# Patient Record
Sex: Female | Born: 1950 | Race: White | Hispanic: No | Marital: Married | Smoking: Former smoker
Health system: Southern US, Community
[De-identification: ages and names within clinical notes are randomized; demographics above are authoritative.]

## PROBLEM LIST (undated history)

## (undated) DIAGNOSIS — E039 Hypothyroidism, unspecified: Secondary | ICD-10-CM

## (undated) DIAGNOSIS — M1990 Unspecified osteoarthritis, unspecified site: Secondary | ICD-10-CM

## (undated) DIAGNOSIS — Z8619 Personal history of other infectious and parasitic diseases: Secondary | ICD-10-CM

## (undated) DIAGNOSIS — E785 Hyperlipidemia, unspecified: Secondary | ICD-10-CM

## (undated) DIAGNOSIS — R202 Paresthesia of skin: Secondary | ICD-10-CM

## (undated) DIAGNOSIS — S62102A Fracture of unspecified carpal bone, left wrist, initial encounter for closed fracture: Secondary | ICD-10-CM

## (undated) DIAGNOSIS — M810 Age-related osteoporosis without current pathological fracture: Secondary | ICD-10-CM

## (undated) DIAGNOSIS — R238 Other skin changes: Secondary | ICD-10-CM

## (undated) DIAGNOSIS — K219 Gastro-esophageal reflux disease without esophagitis: Secondary | ICD-10-CM

## (undated) DIAGNOSIS — T7840XA Allergy, unspecified, initial encounter: Secondary | ICD-10-CM

## (undated) DIAGNOSIS — R233 Spontaneous ecchymoses: Secondary | ICD-10-CM

## (undated) HISTORY — DX: Personal history of other infectious and parasitic diseases: Z86.19

## (undated) HISTORY — DX: Hyperlipidemia, unspecified: E78.5

## (undated) HISTORY — DX: Fracture of unspecified carpal bone, left wrist, initial encounter for closed fracture: S62.102A

## (undated) HISTORY — DX: Gastro-esophageal reflux disease without esophagitis: K21.9

## (undated) HISTORY — DX: Age-related osteoporosis without current pathological fracture: M81.0

## (undated) HISTORY — DX: Allergy, unspecified, initial encounter: T78.40XA

---

## 1991-05-18 HISTORY — PX: DENTAL SURGERY: SHX609

## 1998-05-17 HISTORY — PX: GANGLION CYST EXCISION: SHX1691

## 1998-05-27 ENCOUNTER — Ambulatory Visit (HOSPITAL_COMMUNITY): Admission: RE | Admit: 1998-05-27 | Discharge: 1998-05-27 | Payer: Self-pay | Admitting: Gastroenterology

## 1999-03-11 ENCOUNTER — Encounter: Payer: Self-pay | Admitting: Family Medicine

## 1999-03-11 ENCOUNTER — Encounter: Admission: RE | Admit: 1999-03-11 | Discharge: 1999-03-11 | Payer: Self-pay | Admitting: Family Medicine

## 1999-04-14 ENCOUNTER — Other Ambulatory Visit: Admission: RE | Admit: 1999-04-14 | Discharge: 1999-04-14 | Payer: Self-pay | Admitting: Family Medicine

## 2000-02-23 ENCOUNTER — Encounter: Admission: RE | Admit: 2000-02-23 | Discharge: 2000-02-23 | Payer: Self-pay | Admitting: Family Medicine

## 2000-02-23 ENCOUNTER — Encounter: Payer: Self-pay | Admitting: Family Medicine

## 2000-04-20 ENCOUNTER — Encounter: Admission: RE | Admit: 2000-04-20 | Discharge: 2000-04-20 | Payer: Self-pay | Admitting: Family Medicine

## 2000-04-20 ENCOUNTER — Encounter: Payer: Self-pay | Admitting: Family Medicine

## 2000-04-20 ENCOUNTER — Other Ambulatory Visit: Admission: RE | Admit: 2000-04-20 | Discharge: 2000-04-20 | Payer: Self-pay | Admitting: *Deleted

## 2000-07-19 ENCOUNTER — Encounter: Payer: Self-pay | Admitting: Family Medicine

## 2000-07-19 ENCOUNTER — Encounter: Admission: RE | Admit: 2000-07-19 | Discharge: 2000-07-19 | Payer: Self-pay | Admitting: Family Medicine

## 2001-05-04 ENCOUNTER — Encounter: Admission: RE | Admit: 2001-05-04 | Discharge: 2001-05-04 | Payer: Self-pay | Admitting: Family Medicine

## 2001-05-04 ENCOUNTER — Encounter: Payer: Self-pay | Admitting: Family Medicine

## 2002-04-17 ENCOUNTER — Encounter: Payer: Self-pay | Admitting: Family Medicine

## 2002-04-17 ENCOUNTER — Encounter: Admission: RE | Admit: 2002-04-17 | Discharge: 2002-04-17 | Payer: Self-pay | Admitting: Family Medicine

## 2002-05-29 ENCOUNTER — Other Ambulatory Visit: Admission: RE | Admit: 2002-05-29 | Discharge: 2002-05-29 | Payer: Self-pay | Admitting: Family Medicine

## 2002-07-24 ENCOUNTER — Encounter: Admission: RE | Admit: 2002-07-24 | Discharge: 2002-07-24 | Payer: Self-pay | Admitting: Family Medicine

## 2002-07-24 ENCOUNTER — Encounter: Payer: Self-pay | Admitting: Family Medicine

## 2003-05-24 ENCOUNTER — Encounter: Admission: RE | Admit: 2003-05-24 | Discharge: 2003-05-24 | Payer: Self-pay | Admitting: Family Medicine

## 2003-07-08 ENCOUNTER — Other Ambulatory Visit: Admission: RE | Admit: 2003-07-08 | Discharge: 2003-07-08 | Payer: Self-pay | Admitting: Family Medicine

## 2003-07-22 ENCOUNTER — Encounter: Admission: RE | Admit: 2003-07-22 | Discharge: 2003-07-22 | Payer: Self-pay | Admitting: Family Medicine

## 2004-03-25 ENCOUNTER — Encounter: Admission: RE | Admit: 2004-03-25 | Discharge: 2004-03-25 | Payer: Self-pay | Admitting: Family Medicine

## 2004-09-10 ENCOUNTER — Encounter: Admission: RE | Admit: 2004-09-10 | Discharge: 2004-09-10 | Payer: Self-pay | Admitting: Family Medicine

## 2004-09-16 ENCOUNTER — Other Ambulatory Visit: Admission: RE | Admit: 2004-09-16 | Discharge: 2004-09-16 | Payer: Self-pay | Admitting: Family Medicine

## 2004-09-16 ENCOUNTER — Encounter: Admission: RE | Admit: 2004-09-16 | Discharge: 2004-09-16 | Payer: Self-pay | Admitting: Family Medicine

## 2004-10-06 ENCOUNTER — Encounter: Admission: RE | Admit: 2004-10-06 | Discharge: 2004-10-06 | Payer: Self-pay | Admitting: Family Medicine

## 2005-09-21 ENCOUNTER — Encounter: Admission: RE | Admit: 2005-09-21 | Discharge: 2005-09-21 | Payer: Self-pay | Admitting: Family Medicine

## 2005-09-28 ENCOUNTER — Encounter: Admission: RE | Admit: 2005-09-28 | Discharge: 2005-09-28 | Payer: Self-pay | Admitting: Family Medicine

## 2005-10-14 ENCOUNTER — Encounter: Admission: RE | Admit: 2005-10-14 | Discharge: 2005-10-14 | Payer: Self-pay | Admitting: Family Medicine

## 2006-11-02 ENCOUNTER — Encounter: Admission: RE | Admit: 2006-11-02 | Discharge: 2006-11-02 | Payer: Self-pay | Admitting: Family Medicine

## 2007-10-04 ENCOUNTER — Encounter: Admission: RE | Admit: 2007-10-04 | Discharge: 2007-10-04 | Payer: Self-pay | Admitting: Family Medicine

## 2007-10-17 ENCOUNTER — Encounter: Admission: RE | Admit: 2007-10-17 | Discharge: 2007-10-17 | Payer: Self-pay | Admitting: Family Medicine

## 2007-10-19 ENCOUNTER — Ambulatory Visit (HOSPITAL_COMMUNITY): Admission: RE | Admit: 2007-10-19 | Discharge: 2007-10-19 | Payer: Self-pay | Admitting: Family Medicine

## 2007-11-06 ENCOUNTER — Encounter: Admission: RE | Admit: 2007-11-06 | Discharge: 2007-11-06 | Payer: Self-pay | Admitting: Family Medicine

## 2008-11-05 ENCOUNTER — Encounter: Admission: RE | Admit: 2008-11-05 | Discharge: 2008-11-05 | Payer: Self-pay | Admitting: Family Medicine

## 2008-11-20 ENCOUNTER — Encounter: Admission: RE | Admit: 2008-11-20 | Discharge: 2008-11-20 | Payer: Self-pay | Admitting: Family Medicine

## 2009-10-15 ENCOUNTER — Encounter: Admission: RE | Admit: 2009-10-15 | Discharge: 2009-10-15 | Payer: Self-pay | Admitting: Family Medicine

## 2010-04-22 ENCOUNTER — Encounter
Admission: RE | Admit: 2010-04-22 | Discharge: 2010-04-22 | Payer: Self-pay | Source: Home / Self Care | Admitting: Family Medicine

## 2010-06-08 ENCOUNTER — Encounter: Payer: Self-pay | Admitting: Family Medicine

## 2010-07-24 ENCOUNTER — Other Ambulatory Visit: Payer: Self-pay | Admitting: Diagnostic Neuroimaging

## 2010-07-24 DIAGNOSIS — R200 Anesthesia of skin: Secondary | ICD-10-CM

## 2010-07-24 DIAGNOSIS — R9089 Other abnormal findings on diagnostic imaging of central nervous system: Secondary | ICD-10-CM

## 2010-08-26 ENCOUNTER — Other Ambulatory Visit (HOSPITAL_COMMUNITY)
Admission: RE | Admit: 2010-08-26 | Discharge: 2010-08-26 | Disposition: A | Discharge status: Home / Self Care | Payer: BLUE CROSS/BLUE SHIELD | Source: Ambulatory Visit | Attending: Internal Medicine | Admitting: Internal Medicine

## 2010-08-26 ENCOUNTER — Other Ambulatory Visit: Payer: Self-pay | Admitting: Internal Medicine

## 2010-08-26 DIAGNOSIS — Z01419 Encounter for gynecological examination (general) (routine) without abnormal findings: Secondary | ICD-10-CM | POA: Insufficient documentation

## 2010-08-26 DIAGNOSIS — Z1159 Encounter for screening for other viral diseases: Secondary | ICD-10-CM | POA: Insufficient documentation

## 2010-08-27 ENCOUNTER — Ambulatory Visit
Admission: RE | Admit: 2010-08-27 | Discharge: 2010-08-27 | Disposition: A | Discharge status: Home / Self Care | Payer: BLUE CROSS/BLUE SHIELD | Source: Ambulatory Visit | Attending: Diagnostic Neuroimaging | Admitting: Diagnostic Neuroimaging

## 2010-08-27 DIAGNOSIS — R200 Anesthesia of skin: Secondary | ICD-10-CM

## 2010-08-27 DIAGNOSIS — R9089 Other abnormal findings on diagnostic imaging of central nervous system: Secondary | ICD-10-CM

## 2010-09-01 ENCOUNTER — Inpatient Hospital Stay
Admission: RE | Admit: 2010-09-01 | Discharge: 2010-09-01 | Payer: BLUE CROSS/BLUE SHIELD | Source: Ambulatory Visit | Attending: Neurology | Admitting: Neurology

## 2010-09-01 ENCOUNTER — Other Ambulatory Visit: Payer: Self-pay | Admitting: Neurology

## 2010-09-01 DIAGNOSIS — G971 Other reaction to spinal and lumbar puncture: Secondary | ICD-10-CM

## 2010-09-22 ENCOUNTER — Other Ambulatory Visit: Payer: Self-pay | Admitting: Internal Medicine

## 2010-09-22 DIAGNOSIS — Z1231 Encounter for screening mammogram for malignant neoplasm of breast: Secondary | ICD-10-CM

## 2010-10-06 ENCOUNTER — Ambulatory Visit: Payer: BLUE CROSS/BLUE SHIELD

## 2010-10-28 ENCOUNTER — Ambulatory Visit: Payer: BLUE CROSS/BLUE SHIELD

## 2011-04-13 ENCOUNTER — Ambulatory Visit
Admission: RE | Admit: 2011-04-13 | Discharge: 2011-04-13 | Disposition: A | Discharge status: Home / Self Care | Payer: BC Managed Care – PPO | Source: Ambulatory Visit | Attending: Internal Medicine | Admitting: Internal Medicine

## 2011-04-13 DIAGNOSIS — Z1231 Encounter for screening mammogram for malignant neoplasm of breast: Secondary | ICD-10-CM

## 2011-07-28 LAB — HM COLONOSCOPY: HM Colonoscopy: NORMAL

## 2011-08-27 ENCOUNTER — Other Ambulatory Visit: Payer: Self-pay | Admitting: Internal Medicine

## 2011-08-27 DIAGNOSIS — M8580 Other specified disorders of bone density and structure, unspecified site: Secondary | ICD-10-CM

## 2011-09-08 ENCOUNTER — Ambulatory Visit
Admission: RE | Admit: 2011-09-08 | Discharge: 2011-09-08 | Disposition: A | Discharge status: Home / Self Care | Payer: BC Managed Care – PPO | Source: Ambulatory Visit | Attending: Internal Medicine | Admitting: Internal Medicine

## 2011-09-08 DIAGNOSIS — M8580 Other specified disorders of bone density and structure, unspecified site: Secondary | ICD-10-CM

## 2012-03-31 ENCOUNTER — Other Ambulatory Visit: Payer: Self-pay | Admitting: Internal Medicine

## 2012-03-31 DIAGNOSIS — Z1231 Encounter for screening mammogram for malignant neoplasm of breast: Secondary | ICD-10-CM

## 2012-06-21 ENCOUNTER — Ambulatory Visit
Admission: RE | Admit: 2012-06-21 | Discharge: 2012-06-21 | Disposition: A | Discharge status: Home / Self Care | Payer: BC Managed Care – PPO | Source: Ambulatory Visit | Attending: Internal Medicine | Admitting: Internal Medicine

## 2012-06-21 DIAGNOSIS — Z1231 Encounter for screening mammogram for malignant neoplasm of breast: Secondary | ICD-10-CM

## 2012-06-22 ENCOUNTER — Other Ambulatory Visit: Payer: Self-pay | Admitting: Internal Medicine

## 2012-06-22 DIAGNOSIS — M25552 Pain in left hip: Secondary | ICD-10-CM

## 2012-06-27 ENCOUNTER — Ambulatory Visit
Admission: RE | Admit: 2012-06-27 | Discharge: 2012-06-27 | Disposition: A | Discharge status: Home / Self Care | Payer: BC Managed Care – PPO | Source: Ambulatory Visit | Attending: Internal Medicine | Admitting: Internal Medicine

## 2012-06-27 DIAGNOSIS — M25552 Pain in left hip: Secondary | ICD-10-CM

## 2012-07-10 ENCOUNTER — Encounter (HOSPITAL_COMMUNITY): Payer: Self-pay | Admitting: Pharmacy Technician

## 2012-07-10 ENCOUNTER — Other Ambulatory Visit: Payer: Self-pay | Admitting: Orthopedic Surgery

## 2012-07-12 ENCOUNTER — Encounter: Payer: Self-pay | Admitting: Internal Medicine

## 2012-07-12 ENCOUNTER — Ambulatory Visit (INDEPENDENT_AMBULATORY_CARE_PROVIDER_SITE_OTHER): Payer: BC Managed Care – PPO | Admitting: Internal Medicine

## 2012-07-12 VITALS — BP 118/74 | HR 81 | Temp 98.4°F | Resp 18 | Ht 66.0 in | Wt 123.0 lb

## 2012-07-12 DIAGNOSIS — M8580 Other specified disorders of bone density and structure, unspecified site: Secondary | ICD-10-CM

## 2012-07-12 DIAGNOSIS — E785 Hyperlipidemia, unspecified: Secondary | ICD-10-CM

## 2012-07-12 DIAGNOSIS — K219 Gastro-esophageal reflux disease without esophagitis: Secondary | ICD-10-CM | POA: Insufficient documentation

## 2012-07-12 DIAGNOSIS — M899 Disorder of bone, unspecified: Secondary | ICD-10-CM

## 2012-07-12 DIAGNOSIS — M87052 Idiopathic aseptic necrosis of left femur: Secondary | ICD-10-CM

## 2012-07-12 DIAGNOSIS — E039 Hypothyroidism, unspecified: Secondary | ICD-10-CM

## 2012-07-12 DIAGNOSIS — M87059 Idiopathic aseptic necrosis of unspecified femur: Secondary | ICD-10-CM

## 2012-07-12 DIAGNOSIS — E7849 Other hyperlipidemia: Secondary | ICD-10-CM | POA: Insufficient documentation

## 2012-07-12 DIAGNOSIS — R930 Abnormal findings on diagnostic imaging of skull and head, not elsewhere classified: Secondary | ICD-10-CM | POA: Insufficient documentation

## 2012-07-12 NOTE — Patient Instructions (Addendum)
See me post op

## 2012-07-12 NOTE — Progress Notes (Signed)
Subjective:    Patient ID: Dorothy [name], female    DOB: [birth year], 62 y.o.   MRN: [id number]  HPI Dorothy [name] is  a new pt here to establish [name].   Former [name] at [employer]  PMH of GERD,  Hyperlipidemia,  Osteopenia, hypothyroidism,  And AVN of L hip.  Pt reports she is having hip replacement in 5 days.  She also reports she has an abnormal MRI that is being followed by Dr. Alton Revere  (I do not have copy )    Reports she was trying to get up last night and had severe pain in L hip and fell  Landing on shoulder.  Some improvement of shoudler today  Osteopenia she is on Evista for this.  Has been on Evista for quite some time  Allergies  Allergen Reactions  . Penicillins Rash   Past Medical History  Diagnosis Date  . Thyroid disease   . Hyperlipidemia    History reviewed. No pertinent past surgical history. History   Social History  . Marital Status: Married    Spouse Name: N/A    Number of Children: N/A  . Years of Education: N/A   Occupational History  . Not on file.   Social History Main Topics  . Smoking status: Current Some Day Smoker  . Smokeless tobacco: Not on file  . Alcohol Use: 0.6 oz/week    1 Glasses of wine per week     Comment: daily  . Drug Use: No  . Sexually Active: Yes   Other Topics Concern  . Not on file   Social History Narrative  . No narrative on file   Family History  Problem Relation Age of Onset  . Parkinson's disease Father   . Arthritis Maternal Grandmother    There is no problem list on file for this patient.  Current Outpatient Prescriptions on File Prior to Visit  Medication Sig Dispense Refill  . aspirin EC 81 MG tablet Take 81 mg by mouth daily.      . Calcium Carbonate-Vitamin D (CALCIUM 600+D) 600-400 MG-UNIT per tablet Take 2 tablets by mouth daily.      . Cholecalciferol (VITAMIN D) 2000 UNITS CAPS Take 1 capsule by mouth daily.      Marland Kitchen PRESCRIPTION MEDICATION Apply 1 application topically 2 (two) times daily.  Compounded medication, ketm 10%, baclofen 2%, bupivacaine 1%, cyclobenzaprine 2%, gabapentin 6%, orphen 5%, pent 3%.  Can use 1-2 grams.      . raloxifene (EVISTA) 60 MG tablet Take 60 mg by mouth daily.      Marland Kitchen saccharomyces boulardii (FLORASTOR) 250 MG capsule Take 250 mg by mouth daily.      . simvastatin (ZOCOR) 20 MG tablet Take 20 mg by mouth every evening.      Marland Kitchen levothyroxine (SYNTHROID, LEVOTHROID) 100 MCG tablet Take 100 mcg by mouth daily.      . valACYclovir (VALTREX) 500 MG tablet Take 500 mg by mouth daily as needed. Fever blister/cold sore.       No current facility-administered medications on file prior to visit.        Review of Systems See HPI    Objective:   Physical Exam Physical Exam  Nursing note and vitals reviewed.  Constitutional: She is oriented to person, place, and time. She appears well-developed and well-nourished.  HENT:  Head: Normocephalic and atraumatic.  Cardiovascular: Normal rate and regular rhythm. Exam reveals no gallop and no friction rub.  No murmur heard.  Pulmonary/Chest:  Breath sounds normal. She has no wheezes. She has no rales.  Neurological: She is alert and oriented to person, place, and time.  Skin: Skin is warm and dry.  Psychiatric: She has a normal mood and affect. Her behavior is normal.  M/S  Obvious limping and reduced ROM L hip due to pain       Assessment & Plan:  AVN  L hip  Pending exploration and Hip replacement MOnday    Abnormal MRI  History of paresthesias  Will need old records  R shoulder injury  Pt declines xrays now.  OTC nsaid prn  Hyperlipidemia    Continue Zocor  Hypothyroidisim  Continue Synthroid  GERD  Will schedule CPE  Follow up with me post op when pt is ready

## 2012-07-12 NOTE — Pre-Procedure Instructions (Signed)
Dorothy [name]  07/12/2012   Your procedure is scheduled on: Monday, March 3rd.  Report to [facility] at 5:30 AM.  Call this number if you have problems the morning of surgery: [phone]   Remember:   Do not eat food or drink liquids after midnight.    Take these medicines the morning of surgery with A SIP OF WATER :Levothyroxine (Synthyroid), Valacyclovir (Valtrex) If needed.   Do not wear jewelry, make-up or nail polish.  Do not wear lotions, powders, or perfumes. You may wear deodorant.  Do not shave 48 hours prior to surgery.   Do not bring valuables to the hospital.  Contacts, dentures or bridgework may not be worn into surgery.  Leave suitcase in the car. After surgery it may be brought to your room.  For patients admitted to the hospital, checkout time is 11:00 AM the day of discharge.     Special Instructions: Shower using CHG 2 nights before surgery and the night before surgery.  If you shower the day of surgery use CHG.  Use special wash - you have one bottle of CHG for all showers.  You should use approximately 1/3 of the bottle for each shower.   Please read over the following fact sheets that you were given: Pain Booklet, Coughing and Deep Breathing, Blood Transfusion Information and Surgical Site Infection Prevention

## 2012-07-13 ENCOUNTER — Encounter (HOSPITAL_COMMUNITY): Payer: Self-pay

## 2012-07-13 ENCOUNTER — Encounter (HOSPITAL_COMMUNITY)
Admission: RE | Admit: 2012-07-13 | Discharge: 2012-07-13 | Disposition: A | Discharge status: Home / Self Care | Payer: BC Managed Care – PPO | Source: Ambulatory Visit | Attending: Orthopedic Surgery | Admitting: Orthopedic Surgery

## 2012-07-13 DIAGNOSIS — Z01818 Encounter for other preprocedural examination: Secondary | ICD-10-CM | POA: Insufficient documentation

## 2012-07-13 DIAGNOSIS — Z01811 Encounter for preprocedural respiratory examination: Secondary | ICD-10-CM | POA: Insufficient documentation

## 2012-07-13 DIAGNOSIS — Z01810 Encounter for preprocedural cardiovascular examination: Secondary | ICD-10-CM | POA: Insufficient documentation

## 2012-07-13 DIAGNOSIS — Z01812 Encounter for preprocedural laboratory examination: Secondary | ICD-10-CM | POA: Insufficient documentation

## 2012-07-13 HISTORY — DX: Unspecified osteoarthritis, unspecified site: M19.90

## 2012-07-13 HISTORY — DX: Hypothyroidism, unspecified: E03.9

## 2012-07-13 HISTORY — DX: Paresthesia of skin: R20.2

## 2012-07-13 LAB — BASIC METABOLIC PANEL
BUN: 13 mg/dL (ref 6–23)
CO2: 27 mEq/L (ref 19–32)
Calcium: 9.9 mg/dL (ref 8.4–10.5)
Chloride: 103 mEq/L (ref 96–112)
Creatinine, Ser: 0.73 mg/dL (ref 0.50–1.10)
GFR calc Af Amer: >90 mL/min (ref >90)
GFR calc non Af Amer: >90 mL/min (ref >90)
Glucose, Bld: 84 mg/dL (ref 70–99)
Potassium: 3.7 mEq/L (ref 3.5–5.1)
Sodium: 140 mEq/L (ref 135–145)

## 2012-07-13 LAB — CBC WITH DIFFERENTIAL/PLATELET
Basophils Absolute: 0 10*3/uL (ref 0.0–0.1)
Basophils Relative: 0 % (ref 0–1)
Eosinophils Absolute: 0.2 10*3/uL (ref 0.0–0.7)
Eosinophils Relative: 2 % (ref 0–5)
HCT: 40.9 % (ref 36.0–46.0)
Hemoglobin: 14.4 g/dL (ref 12.0–15.0)
Lymphocytes Relative: 30 % (ref 12–46)
Lymphs Abs: 2.1 10*3/uL (ref 0.7–4.0)
MCH: 33.5 pg (ref 26.0–34.0)
MCHC: 35.2 g/dL (ref 30.0–36.0)
MCV: 95.1 fL (ref 78.0–100.0)
Monocytes Absolute: 0.9 10*3/uL (ref 0.1–1.0)
Monocytes Relative: 12 % (ref 3–12)
Neutro Abs: 3.9 10*3/uL (ref 1.7–7.7)
Neutrophils Relative %: 55 % (ref 43–77)
Platelets: 276 10*3/uL (ref 150–400)
RBC: 4.3 MIL/uL (ref 3.87–5.11)
RDW: 12.7 % (ref 11.5–15.5)
WBC: 7.1 10*3/uL (ref 4.0–10.5)

## 2012-07-13 LAB — URINALYSIS, ROUTINE W REFLEX MICROSCOPIC
Bilirubin Urine: NEGATIVE
Glucose, UA: NEGATIVE mg/dL
Hgb urine dipstick: NEGATIVE
Ketones, ur: NEGATIVE mg/dL
Leukocytes, UA: NEGATIVE
Nitrite: NEGATIVE
Protein, ur: NEGATIVE mg/dL
Specific Gravity, Urine: 1.008 (ref 1.005–1.030)
Urobilinogen, UA: 0.2 mg/dL (ref 0.0–1.0)
pH: 7 (ref 5.0–8.0)

## 2012-07-13 LAB — ABO/RH: ABO/RH(D): B POS

## 2012-07-13 LAB — SURGICAL PCR SCREEN
MRSA, PCR: NEGATIVE
Staphylococcus aureus: NEGATIVE

## 2012-07-13 LAB — PROTIME-INR
INR: 0.9 (ref 0.00–1.49)
Prothrombin Time: 12.1 seconds (ref 11.6–15.2)

## 2012-07-13 LAB — TYPE AND SCREEN
ABO/RH(D): B POS
Antibody Screen: NEGATIVE

## 2012-07-13 LAB — APTT: aPTT: 28 seconds (ref 24–37)

## 2012-07-15 NOTE — H&P (Addendum)
Dorothy [name] is an 62 y.o. female.   Chief Complaint: Left Hip pain HPI: Patient presents with a chief complaint of left hip pain with a recent MRI scan showing AVN of the left hip greater than 50% of the femoral head and the right hip about 30-40% of the femoral head.  She first noticed the left groin pain in August playing tennis unfortunately, it is gotten progressively worse it is now severe with a sharp stabbing pain some associated weakness and does wake her from sleep.  Riding in a car also makes pain worse, as does any movement.  The pain does come and go to some extent, but overall is definitely getting worse.  No prior problems with the hip.  Past Medical History  Diagnosis Date  . Thyroid disease   . Hyperlipidemia   . PONV (postoperative nausea and vomiting)   . Hypothyroidism   . Arthritis   . Osteopenia   . 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 Ganglion cyst excision Right   . Dental surgery  1993    Family History  Problem Relation Age of Onset  . Parkinson's disease Father   . Arthritis Maternal Grandmother    Social History:  reports that she quit smoking about 8 months ago. Her smoking use included Cigarettes. She smoked 0.00 packs per day for 20 years. She has never used smokeless tobacco. She reports that she does not use illicit drugs. Her alcohol history is not on file.  Allergies:  Allergies  Allergen Reactions  . Penicillins Rash    No prescriptions prior to admission    No results found for this or any previous visit (from the past 48 hour(s)). No results found.  Review of Systems  Constitutional: Negative.   HENT: Negative.   Eyes: Negative.   Respiratory: Negative.   Cardiovascular: Negative.   Gastrointestinal: Negative.   Genitourinary: Negative.   Musculoskeletal: Positive for joint pain.  Skin: Negative.   Neurological: Negative.    Endo/Heme/Allergies: Negative.   Psychiatric/Behavioral: Negative.     There were no vitals taken for this visit. Physical Exam  Constitutional: She is oriented to person, place, and time. She appears well-developed and well-nourished.  HENT:  Head: Normocephalic.  Eyes: Pupils are equal, round, and reactive to light.  Cardiovascular: Normal heart sounds.   Respiratory: Breath sounds normal.  GI: Bowel sounds are normal.  Musculoskeletal:       Left hip: She exhibits decreased range of motion and tenderness.  Neurological: She is alert and oriented to person, place, and time.  Psychiatric: She has a normal mood and affect.    The patient walks with a profound left-sided limp.  Her hip is highly irritable to internal or external rotation.  Foot tap is negative.  She is neurovascularly intact.  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  Plan: The pathology of avascular necrosis was discussed with the patient and ultimately the treatment on the left side will be hip replacement surgery.  Untreated, the pain in the left hip will get progressively worse, interfere with ambulation and activities of daily living.  With this in mind hip replacement surgery was discussed at length with the patient, models were brought into the room and the risks and benefits of surgery were discussed.  These include infection nerve vessel or tendon damage, as  well as blood clots or problems with anesthesia.  All of these risks and total are less than 1%.  I have filled out a surgery sheet and given her a card so that she can call my scheduler Agustin Cree if and when she decides to schedule surgery.  For pain control.  We will use, Norco 5 mg by mouth twice a day when necessary dispense 60 no refills.  WILLIAMS,JENNIFER M. 07/15/2012, 12:36 PM

## 2012-07-16 MED ORDER — CHLORHEXIDINE GLUCONATE 4 % EX LIQD: 60.0000 mL | Freq: Once | CUTANEOUS | Status: DC

## 2012-07-16 MED ORDER — CEFAZOLIN SODIUM-DEXTROSE 2-3 GM-% IV SOLR: 2.0000 g | INTRAVENOUS | Status: DC

## 2012-07-17 ENCOUNTER — Inpatient Hospital Stay (HOSPITAL_COMMUNITY)
Admission: RE | Admit: 2012-07-17 | Discharge: 2012-07-18 | DRG: 818 | Disposition: A | Discharge status: Home Health | Payer: BC Managed Care – PPO | Source: Ambulatory Visit | Attending: Orthopedic Surgery | Admitting: Orthopedic Surgery

## 2012-07-17 ENCOUNTER — Encounter (HOSPITAL_COMMUNITY)
Admission: RE | Disposition: A | Discharge status: Home Health | Payer: Self-pay | Source: Ambulatory Visit | Attending: Orthopedic Surgery

## 2012-07-17 ENCOUNTER — Observation Stay (HOSPITAL_COMMUNITY): Payer: BC Managed Care – PPO

## 2012-07-17 ENCOUNTER — Ambulatory Visit (HOSPITAL_COMMUNITY): Payer: BC Managed Care – PPO | Admitting: Certified Registered Nurse Anesthetist

## 2012-07-17 ENCOUNTER — Encounter (HOSPITAL_COMMUNITY): Payer: Self-pay | Admitting: Certified Registered Nurse Anesthetist

## 2012-07-17 ENCOUNTER — Encounter (HOSPITAL_COMMUNITY): Payer: Self-pay | Admitting: *Deleted

## 2012-07-17 DIAGNOSIS — E039 Hypothyroidism, unspecified: Secondary | ICD-10-CM | POA: Diagnosis present

## 2012-07-17 DIAGNOSIS — Z01818 Encounter for other preprocedural examination: Secondary | ICD-10-CM

## 2012-07-17 DIAGNOSIS — Z01812 Encounter for preprocedural laboratory examination: Secondary | ICD-10-CM

## 2012-07-17 DIAGNOSIS — M87059 Idiopathic aseptic necrosis of unspecified femur: Principal | ICD-10-CM | POA: Diagnosis present

## 2012-07-17 DIAGNOSIS — Z7982 Long term (current) use of aspirin: Secondary | ICD-10-CM

## 2012-07-17 DIAGNOSIS — Z23 Encounter for immunization: Secondary | ICD-10-CM

## 2012-07-17 DIAGNOSIS — M87052 Idiopathic aseptic necrosis of left femur: Secondary | ICD-10-CM

## 2012-07-17 DIAGNOSIS — E785 Hyperlipidemia, unspecified: Secondary | ICD-10-CM | POA: Diagnosis present

## 2012-07-17 DIAGNOSIS — Z79899 Other long term (current) drug therapy: Secondary | ICD-10-CM

## 2012-07-17 DIAGNOSIS — Z01810 Encounter for preprocedural cardiovascular examination: Secondary | ICD-10-CM

## 2012-07-17 DIAGNOSIS — Z01811 Encounter for preprocedural respiratory examination: Secondary | ICD-10-CM

## 2012-07-17 DIAGNOSIS — Z87891 Personal history of nicotine dependence: Secondary | ICD-10-CM

## 2012-07-17 HISTORY — DX: Spontaneous ecchymoses: R23.3

## 2012-07-17 HISTORY — PX: TOTAL HIP ARTHROPLASTY: SHX124

## 2012-07-17 HISTORY — DX: Other skin changes: R23.8

## 2012-07-17 SURGERY — ARTHROPLASTY, HIP, TOTAL,POSTERIOR APPROACH
Anesthesia: General | Site: Hip | Laterality: Left | Wound class: Clean

## 2012-07-17 MED ORDER — SCOPOLAMINE 1 MG/3DAYS TD PT72: 1.0000 | MEDICATED_PATCH | TRANSDERMAL | Status: DC

## 2012-07-17 MED ORDER — VITAMIN D 50 MCG (2000 UT) PO CAPS: 1.0000 | ORAL_CAPSULE | Freq: Every day | ORAL | Status: DC

## 2012-07-17 MED ORDER — VANCOMYCIN HCL IN DEXTROSE 1-5 GM/200ML-% IV SOLN
INTRAVENOUS | Status: AC
  Filled 2012-07-17: qty 200

## 2012-07-17 MED ORDER — METOCLOPRAMIDE HCL 10 MG PO TABS: 5.0000 mg | ORAL_TABLET | Freq: Three times a day (TID) | ORAL | Status: DC | PRN

## 2012-07-17 MED ORDER — CALCIUM CARBONATE-VITAMIN D 500-200 MG-UNIT PO TABS
2.0000 | ORAL_TABLET | Freq: Every day | ORAL | Status: DC
  Administered 2012-07-18: 2 via ORAL
  Filled 2012-07-17 (×2): qty 2

## 2012-07-17 MED ORDER — HYDROMORPHONE HCL PF 1 MG/ML IJ SOLN
1.0000 mg | INTRAMUSCULAR | Status: DC | PRN
  Administered 2012-07-18: 1 mg via INTRAVENOUS
  Filled 2012-07-17: qty 1

## 2012-07-17 MED ORDER — EPHEDRINE SULFATE 50 MG/ML IJ SOLN
INTRAMUSCULAR | Status: DC | PRN
  Administered 2012-07-17: 5 mg via INTRAVENOUS
  Administered 2012-07-17 (×2): 10 mg via INTRAVENOUS

## 2012-07-17 MED ORDER — VITAMIN D3 25 MCG (1000 UNIT) PO TABS
2000.0000 [IU] | ORAL_TABLET | Freq: Every day | ORAL | Status: DC
  Administered 2012-07-18: 2000 [IU] via ORAL
  Filled 2012-07-17 (×2): qty 2

## 2012-07-17 MED ORDER — ONDANSETRON HCL 4 MG/2ML IJ SOLN
INTRAMUSCULAR | Status: DC | PRN
  Administered 2012-07-17: 4 mg via INTRAVENOUS

## 2012-07-17 MED ORDER — ACETAMINOPHEN 10 MG/ML IV SOLN
INTRAVENOUS | Status: AC
  Filled 2012-07-17: qty 100

## 2012-07-17 MED ORDER — HYDROMORPHONE HCL PF 1 MG/ML IJ SOLN
INTRAMUSCULAR | Status: AC
  Filled 2012-07-17: qty 2

## 2012-07-17 MED ORDER — CALCIUM CARBONATE-VITAMIN D 600-400 MG-UNIT PO TABS: 2.0000 | ORAL_TABLET | Freq: Every day | ORAL | Status: DC

## 2012-07-17 MED ORDER — MEPERIDINE HCL 25 MG/ML IJ SOLN: 6.2500 mg | INTRAMUSCULAR | Status: DC | PRN

## 2012-07-17 MED ORDER — MENTHOL 3 MG MT LOZG: 1.0000 | LOZENGE | OROMUCOSAL | Status: DC | PRN

## 2012-07-17 MED ORDER — MAGNESIUM HYDROXIDE 400 MG/5ML PO SUSP: 30.0000 mL | Freq: Every day | ORAL | Status: DC | PRN

## 2012-07-17 MED ORDER — FLEET ENEMA 7-19 GM/118ML RE ENEM: 1.0000 | ENEMA | Freq: Once | RECTAL | Status: AC | PRN

## 2012-07-17 MED ORDER — LEVOTHYROXINE SODIUM 100 MCG PO TABS
100.0000 ug | ORAL_TABLET | Freq: Every day | ORAL | Status: DC
  Administered 2012-07-18: 100 ug via ORAL
  Filled 2012-07-17 (×2): qty 1

## 2012-07-17 MED ORDER — ONDANSETRON HCL 4 MG/2ML IJ SOLN: 4.0000 mg | Freq: Four times a day (QID) | INTRAMUSCULAR | Status: DC | PRN

## 2012-07-17 MED ORDER — ACETAMINOPHEN 10 MG/ML IV SOLN
1000.0000 mg | Freq: Once | INTRAVENOUS | Status: AC
  Administered 2012-07-17: 1000 mg via INTRAVENOUS
  Filled 2012-07-17: qty 100

## 2012-07-17 MED ORDER — OXYCODONE HCL 5 MG PO TABS
5.0000 mg | ORAL_TABLET | ORAL | Status: DC | PRN
  Administered 2012-07-17 – 2012-07-18 (×4): 10 mg via ORAL
  Filled 2012-07-17 (×4): qty 2

## 2012-07-17 MED ORDER — GLYCOPYRROLATE 0.2 MG/ML IJ SOLN
INTRAMUSCULAR | Status: DC | PRN
  Administered 2012-07-17: .4 mg via INTRAVENOUS

## 2012-07-17 MED ORDER — PHENOL 1.4 % MT LIQD: 1.0000 | OROMUCOSAL | Status: DC | PRN

## 2012-07-17 MED ORDER — ACETAMINOPHEN 10 MG/ML IV SOLN
1000.0000 mg | Freq: Four times a day (QID) | INTRAVENOUS | Status: AC
  Administered 2012-07-17 – 2012-07-18 (×4): 1000 mg via INTRAVENOUS
  Filled 2012-07-17 (×4): qty 100

## 2012-07-17 MED ORDER — SCOPOLAMINE 1 MG/3DAYS TD PT72
MEDICATED_PATCH | TRANSDERMAL | Status: AC
  Administered 2012-07-17: 1 via TRANSDERMAL
  Filled 2012-07-17: qty 1

## 2012-07-17 MED ORDER — ACETAMINOPHEN 325 MG PO TABS: 650.0000 mg | ORAL_TABLET | Freq: Four times a day (QID) | ORAL | Status: DC | PRN

## 2012-07-17 MED ORDER — NEOSTIGMINE METHYLSULFATE 1 MG/ML IJ SOLN
INTRAMUSCULAR | Status: DC | PRN
  Administered 2012-07-17: 3 mg via INTRAVENOUS

## 2012-07-17 MED ORDER — MIDAZOLAM HCL 2 MG/2ML IJ SOLN: 0.5000 mg | Freq: Once | INTRAMUSCULAR | Status: DC | PRN

## 2012-07-17 MED ORDER — PNEUMOCOCCAL VAC POLYVALENT 25 MCG/0.5ML IJ INJ
0.5000 mL | INJECTION | INTRAMUSCULAR | Status: AC
  Administered 2012-07-18: 0.5 mL via INTRAMUSCULAR
  Filled 2012-07-17: qty 0.5

## 2012-07-17 MED ORDER — DIPHENHYDRAMINE HCL 12.5 MG/5ML PO ELIX: 12.5000 mg | ORAL_SOLUTION | ORAL | Status: DC | PRN

## 2012-07-17 MED ORDER — BUPIVACAINE-EPINEPHRINE 0.5% -1:200000 IJ SOLN
INTRAMUSCULAR | Status: DC | PRN
  Administered 2012-07-17: 10 mL

## 2012-07-17 MED ORDER — CELECOXIB 200 MG PO CAPS
200.0000 mg | ORAL_CAPSULE | Freq: Two times a day (BID) | ORAL | Status: DC
  Administered 2012-07-17 – 2012-07-18 (×3): 200 mg via ORAL
  Filled 2012-07-17 (×4): qty 1

## 2012-07-17 MED ORDER — ALUM & MAG HYDROXIDE-SIMETH 200-200-20 MG/5ML PO SUSP: 30.0000 mL | ORAL | Status: DC | PRN

## 2012-07-17 MED ORDER — ROCURONIUM BROMIDE 100 MG/10ML IV SOLN
INTRAVENOUS | Status: DC | PRN
  Administered 2012-07-17: 50 mg via INTRAVENOUS

## 2012-07-17 MED ORDER — ACETAMINOPHEN 650 MG RE SUPP: 650.0000 mg | Freq: Four times a day (QID) | RECTAL | Status: DC | PRN

## 2012-07-17 MED ORDER — DEXTROSE-NACL 5-0.45 % IV SOLN: INTRAVENOUS | Status: DC

## 2012-07-17 MED ORDER — SIMVASTATIN 20 MG PO TABS
20.0000 mg | ORAL_TABLET | Freq: Every evening | ORAL | Status: DC
  Administered 2012-07-17: 20 mg via ORAL
  Filled 2012-07-17 (×2): qty 1

## 2012-07-17 MED ORDER — MIDAZOLAM HCL 5 MG/5ML IJ SOLN
INTRAMUSCULAR | Status: DC | PRN
  Administered 2012-07-17: 2 mg via INTRAVENOUS

## 2012-07-17 MED ORDER — PROPOFOL 10 MG/ML IV BOLUS
INTRAVENOUS | Status: DC | PRN
  Administered 2012-07-17: 150 mg via INTRAVENOUS

## 2012-07-17 MED ORDER — LACTATED RINGERS IV SOLN
INTRAVENOUS | Status: DC | PRN
  Administered 2012-07-17 (×2): via INTRAVENOUS

## 2012-07-17 MED ORDER — RALOXIFENE HCL 60 MG PO TABS
60.0000 mg | ORAL_TABLET | Freq: Every day | ORAL | Status: DC
  Administered 2012-07-17: 60 mg via ORAL
  Filled 2012-07-17 (×2): qty 1

## 2012-07-17 MED ORDER — BISACODYL 5 MG PO TBEC: 5.0000 mg | DELAYED_RELEASE_TABLET | Freq: Every day | ORAL | Status: DC | PRN

## 2012-07-17 MED ORDER — VANCOMYCIN HCL 1000 MG IV SOLR
1000.0000 mg | Freq: Two times a day (BID) | INTRAVENOUS | Status: DC
  Administered 2012-07-17: 1000 mg via INTRAVENOUS

## 2012-07-17 MED ORDER — HYDROMORPHONE HCL PF 1 MG/ML IJ SOLN
0.2500 mg | INTRAMUSCULAR | Status: DC | PRN
  Administered 2012-07-17 (×4): 0.5 mg via INTRAVENOUS

## 2012-07-17 MED ORDER — FENTANYL CITRATE 0.05 MG/ML IJ SOLN
INTRAMUSCULAR | Status: DC | PRN
  Administered 2012-07-17: 250 ug via INTRAVENOUS
  Administered 2012-07-17: 50 ug via INTRAVENOUS

## 2012-07-17 MED ORDER — DEXAMETHASONE SODIUM PHOSPHATE 10 MG/ML IJ SOLN
INTRAMUSCULAR | Status: DC | PRN
  Administered 2012-07-17: 8 mg via INTRAVENOUS

## 2012-07-17 MED ORDER — VALACYCLOVIR HCL 500 MG PO TABS
500.0000 mg | ORAL_TABLET | Freq: Every day | ORAL | Status: DC | PRN
  Administered 2012-07-18: 500 mg via ORAL
  Filled 2012-07-17 (×2): qty 1

## 2012-07-17 MED ORDER — LIDOCAINE HCL (CARDIAC) 20 MG/ML IV SOLN
INTRAVENOUS | Status: DC | PRN
  Administered 2012-07-17: 20 mg via INTRAVENOUS

## 2012-07-17 MED ORDER — ONDANSETRON HCL 4 MG PO TABS: 4.0000 mg | ORAL_TABLET | Freq: Four times a day (QID) | ORAL | Status: DC | PRN

## 2012-07-17 MED ORDER — ASPIRIN EC 325 MG PO TBEC
325.0000 mg | DELAYED_RELEASE_TABLET | Freq: Two times a day (BID) | ORAL | Status: DC
  Administered 2012-07-17 – 2012-07-18 (×3): 325 mg via ORAL
  Filled 2012-07-17 (×4): qty 1

## 2012-07-17 MED ORDER — DOCUSATE SODIUM 100 MG PO CAPS: 100.0000 mg | ORAL_CAPSULE | Freq: Four times a day (QID) | ORAL | Status: DC | PRN

## 2012-07-17 MED ORDER — KCL IN DEXTROSE-NACL 20-5-0.45 MEQ/L-%-% IV SOLN
INTRAVENOUS | Status: DC
  Administered 2012-07-17 – 2012-07-18 (×3): via INTRAVENOUS
  Filled 2012-07-17 (×6): qty 1000

## 2012-07-17 MED ORDER — METOCLOPRAMIDE HCL 5 MG/ML IJ SOLN: 5.0000 mg | Freq: Three times a day (TID) | INTRAMUSCULAR | Status: DC | PRN

## 2012-07-17 MED ORDER — OXYCODONE HCL 5 MG PO TABS: 5.0000 mg | ORAL_TABLET | Freq: Once | ORAL | Status: DC | PRN

## 2012-07-17 MED ORDER — DEXTROSE 5 % IV SOLN
500.0000 mg | Freq: Four times a day (QID) | INTRAVENOUS | Status: DC | PRN
  Administered 2012-07-18: 500 mg via INTRAVENOUS
  Filled 2012-07-17: qty 5

## 2012-07-17 MED ORDER — METHOCARBAMOL 500 MG PO TABS
500.0000 mg | ORAL_TABLET | Freq: Four times a day (QID) | ORAL | Status: DC | PRN
  Administered 2012-07-17 – 2012-07-18 (×2): 500 mg via ORAL
  Filled 2012-07-17 (×2): qty 1

## 2012-07-17 MED ORDER — OXYCODONE HCL 5 MG/5ML PO SOLN
5.0000 mg | Freq: Once | ORAL | Status: DC | PRN
Start: 2012-07-17 — End: 2012-07-17

## 2012-07-17 MED ORDER — PROMETHAZINE HCL 25 MG/ML IJ SOLN: 6.2500 mg | INTRAMUSCULAR | Status: DC | PRN

## 2012-07-17 MED ORDER — SODIUM CHLORIDE 0.9 % IR SOLN
Status: DC | PRN
  Administered 2012-07-17: 1000 mL

## 2012-07-17 MED ORDER — SACCHAROMYCES BOULARDII 250 MG PO CAPS
250.0000 mg | ORAL_CAPSULE | Freq: Every day | ORAL | Status: DC
Start: 2012-07-17 — End: 2012-07-18
  Administered 2012-07-17 – 2012-07-18 (×2): 250 mg via ORAL
  Filled 2012-07-17 (×2): qty 1

## 2012-07-17 SURGICAL SUPPLY — 54 items
BLADE SAW SAG 73X25 THK (BLADE) ×1
BLADE SAW SGTL 18X1.27X75 (BLADE) IMPLANT
BLADE SAW SGTL 73X25 THK (BLADE) ×1 IMPLANT
BLADE SAW SGTL MED 73X18.5 STR (BLADE) IMPLANT
BRUSH FEMORAL CANAL (MISCELLANEOUS) IMPLANT
CLOTH BEACON ORANGE TIMEOUT ST (SAFETY) ×2 IMPLANT
COVER BACK TABLE 24X17X13 BIG (DRAPES) IMPLANT
COVER SURGICAL LIGHT HANDLE (MISCELLANEOUS) ×4 IMPLANT
DRAPE ORTHO SPLIT 77X108 STRL (DRAPES) ×1
DRAPE PROXIMA HALF (DRAPES) ×2 IMPLANT
DRAPE SURG ORHT 6 SPLT 77X108 (DRAPES) ×1 IMPLANT
DRAPE U-SHAPE 47X51 STRL (DRAPES) ×2 IMPLANT
DRILL BIT 7/64X5 (BIT) ×2 IMPLANT
DRSG MEPILEX BORDER 4X12 (GAUZE/BANDAGES/DRESSINGS) ×2 IMPLANT
DRSG MEPILEX BORDER 4X8 (GAUZE/BANDAGES/DRESSINGS) ×2 IMPLANT
DURAPREP 26ML APPLICATOR (WOUND CARE) ×2 IMPLANT
ELECT BLADE 4.0 EZ CLEAN MEGAD (MISCELLANEOUS) ×2
ELECT REM PT RETURN 9FT ADLT (ELECTROSURGICAL) ×2
ELECTRODE BLDE 4.0 EZ CLN MEGD (MISCELLANEOUS) ×1 IMPLANT
ELECTRODE REM PT RTRN 9FT ADLT (ELECTROSURGICAL) ×1 IMPLANT
GAUZE XEROFORM 1X8 LF (GAUZE/BANDAGES/DRESSINGS) ×2 IMPLANT
GLOVE BIO SURGEON STRL SZ7 (GLOVE) ×2 IMPLANT
GLOVE BIO SURGEON STRL SZ7.5 (GLOVE) ×2 IMPLANT
GLOVE BIOGEL PI IND STRL 7.0 (GLOVE) ×1 IMPLANT
GLOVE BIOGEL PI IND STRL 8 (GLOVE) ×1 IMPLANT
GLOVE BIOGEL PI INDICATOR 7.0 (GLOVE) ×1
GLOVE BIOGEL PI INDICATOR 8 (GLOVE) ×1
GOWN PREVENTION PLUS XLARGE (GOWN DISPOSABLE) ×2 IMPLANT
GOWN STRL NON-REIN LRG LVL3 (GOWN DISPOSABLE) ×4 IMPLANT
HANDPIECE INTERPULSE COAX TIP (DISPOSABLE)
HOOD PEEL AWAY FACE SHEILD DIS (HOOD) ×4 IMPLANT
KIT BASIN OR (CUSTOM PROCEDURE TRAY) ×2 IMPLANT
KIT ROOM TURNOVER OR (KITS) ×2 IMPLANT
MANIFOLD NEPTUNE II (INSTRUMENTS) ×2 IMPLANT
NEEDLE 22X1 1/2 (OR ONLY) (NEEDLE) ×2 IMPLANT
NS IRRIG 1000ML POUR BTL (IV SOLUTION) ×2 IMPLANT
PACK TOTAL JOINT (CUSTOM PROCEDURE TRAY) ×2 IMPLANT
PAD ARMBOARD 7.5X6 YLW CONV (MISCELLANEOUS) ×4 IMPLANT
PASSER SUT SWANSON 36MM LOOP (INSTRUMENTS) ×2 IMPLANT
PRESSURIZER FEMORAL UNIV (MISCELLANEOUS) IMPLANT
SET HNDPC FAN SPRY TIP SCT (DISPOSABLE) IMPLANT
SUT ETHIBOND 2 V 37 (SUTURE) ×2 IMPLANT
SUT ETHILON 3 0 FSL (SUTURE) ×2 IMPLANT
SUT VIC AB 0 CTB1 27 (SUTURE) ×2 IMPLANT
SUT VIC AB 1 CTX 36 (SUTURE) ×1
SUT VIC AB 1 CTX36XBRD ANBCTR (SUTURE) ×1 IMPLANT
SUT VIC AB 2-0 CTB1 (SUTURE) ×2 IMPLANT
SUT VIC AB 3-0 FS2 27 (SUTURE) ×2 IMPLANT
SYR CONTROL 10ML LL (SYRINGE) ×2 IMPLANT
TOWEL OR 17X24 6PK STRL BLUE (TOWEL DISPOSABLE) ×2 IMPLANT
TOWEL OR 17X26 10 PK STRL BLUE (TOWEL DISPOSABLE) ×2 IMPLANT
TOWER CARTRIDGE SMART MIX (DISPOSABLE) IMPLANT
TRAY FOLEY CATH 14FR (SET/KITS/TRAYS/PACK) IMPLANT
WATER STERILE IRR 1000ML POUR (IV SOLUTION) ×2 IMPLANT

## 2012-07-17 NOTE — Anesthesia Postprocedure Evaluation (Signed)
  Anesthesia Post-op Note  Patient: Dorothy [name]  Procedure(s) Performed: Procedure(s): TOTAL HIP ARTHROPLASTY (Left)  Patient Location: PACU  Anesthesia Type:General  Level of Consciousness: awake, alert , oriented and patient cooperative  Airway and Oxygen Therapy: Patient Spontanous Breathing and Patient connected to nasal cannula oxygen  Post-op Pain: none  Post-op Assessment: Post-op Vital signs reviewed, Patient's Cardiovascular Status Stable, Respiratory Function Stable, Patent Airway, No signs of Nausea or vomiting and Pain level controlled  Post-op Vital Signs: Reviewed and stable  Complications: No apparent anesthesia complications

## 2012-07-17 NOTE — Transfer of Care (Signed)
Immediate Anesthesia Transfer of Care Note  Patient: Dorothy [name]  Procedure(s) Performed: Procedure(s): TOTAL HIP ARTHROPLASTY (Left)  Patient Location: PACU  Anesthesia Type:General  Level of Consciousness: awake, alert , oriented and patient cooperative  Airway & Oxygen Therapy: Patient Spontanous Breathing and Patient connected to nasal cannula oxygen  Post-op Assessment: Report given to PACU RN, Post -op Vital signs reviewed and stable and Patient moving all extremities X 4  Post vital signs: Reviewed and stable  Complications: No apparent anesthesia complications

## 2012-07-17 NOTE — Anesthesia Preprocedure Evaluation (Addendum)
Anesthesia Evaluation  Patient identified by MRN, date of birth, ID band Patient awake    Reviewed: Allergy & Precautions, H&P , NPO status , Patient's Chart, lab work & pertinent test results  History of Anesthesia Complications (+) PONV  Airway Mallampati: II TM Distance: >3 FB Neck ROM: Full    Dental  (+) Teeth Intact and Dental Advisory Given   Pulmonary Current Smoker,  breath sounds clear to auscultation  Pulmonary exam normal       Cardiovascular negative cardio ROS  - pacemakerRhythm:Regular Rate:Normal     Neuro/Psych negative neurological ROS     GI/Hepatic negative GI ROS, Neg liver ROS, GERD-  Controlled,  Endo/Other  Hypothyroidism (on replacement)   Renal/GU negative Renal ROS     Musculoskeletal  (+) Arthritis -, Osteoarthritis,    Abdominal   Peds  Hematology   Anesthesia Other Findings   Reproductive/Obstetrics                        Anesthesia Physical Anesthesia Plan  ASA: II  Anesthesia Plan: General   Post-op Pain Management:    Induction: Intravenous  Airway Management Planned: Oral ETT  Additional Equipment:   Intra-op Plan:   Post-operative Plan: Extubation in OR  Informed Consent: I have reviewed the patients History and Physical, chart, labs and discussed the procedure including the risks, benefits and alternatives for the proposed anesthesia with the patient or authorized representative who has indicated his/her understanding and acceptance.   Dental advisory given  Plan Discussed with: Surgeon, CRNA and Anesthesiologist  Anesthesia Plan Comments: (Plan routine monitors, GETA)       Anesthesia Quick Evaluation

## 2012-07-17 NOTE — Evaluation (Signed)
Physical Therapy Evaluation Patient Details Name: Dorothy [name] MRN: [id number] DOB: 01-Jun-1950 Today's Date: 07/17/2012 Time: 4098-1191 PT Time Calculation (min): 31 min  PT Assessment / Plan / Recommendation Clinical Impression  Patient is a 62 yo female s/p Lt. THA.  Will benefit from acute PT to maximize independence prior to discharge home with family.    PT Assessment  Patient needs continued PT services    Follow Up Recommendations  Home health PT;Supervision/Assistance - 24 hour    Does the patient have the potential to tolerate intense rehabilitation      Barriers to Discharge None      Equipment Recommendations  Rolling walker with 5" wheels (3-in-1 BSC)    Recommendations for Other Services     Frequency 7X/week    Precautions / Restrictions Precautions Precautions: Posterior Hip Precaution Booklet Issued: Yes (comment) Precaution Comments: Reviewed precautions with patient and husband, and provided handout. Restrictions Weight Bearing Restrictions: Yes LLE Weight Bearing: Weight bearing as tolerated   Pertinent Vitals/Pain Pain and lightheadedness limiting mobility today.      Mobility  Bed Mobility Bed Mobility: Supine to Sit;Sitting - Scoot to [place] of Bed;Sit to Supine Supine to Sit: 4: Min assist;HOB flat Sitting - Scoot to [place] of Bed: 4: Min guard Sit to Supine: 4: Min assist;HOB flat Details for Bed Mobility Assistance: Verbal cues for technique to maintain hip precautions.  Assist to move LLE off of and onto bed.  Patient sat at EOB x 6 minutes with good balance.  Patient became lightheaded and returned to supine. Transfers Transfers: Not assessed    Exercises Total Joint Exercises Ankle Circles/Pumps: AROM;Both;15 reps;Supine   PT Diagnosis: Difficulty walking;Acute pain  PT Problem List: Decreased strength;Decreased range of motion;Decreased activity tolerance;Decreased balance;Decreased mobility;Decreased knowledge of use of  DME;Decreased knowledge of precautions;Pain PT Treatment Interventions: DME instruction;Gait training;Stair training;Functional mobility training;Therapeutic exercise;Patient/family education   PT Goals Acute Rehab PT Goals PT Goal Formulation: With patient/family Time For Goal Achievement: 07/24/12 Potential to Achieve Goals: Good Pt will go Supine/Side to Sit: with supervision;with HOB 0 degrees PT Goal: Supine/Side to Sit - Progress: Goal set today Pt will go Sit to Supine/Side: with supervision;with HOB 0 degrees PT Goal: Sit to Supine/Side - Progress: Goal set today Pt will go Sit to Stand: with supervision;with upper extremity assist PT Goal: Sit to Stand - Progress: Goal set today Pt will Ambulate: 51 - 150 feet;with supervision;with rolling walker PT Goal: Ambulate - Progress: Goal set today Pt will Go Up / Down Stairs: 6-9 stairs;with min assist;with rail(s);with least restrictive assistive device PT Goal: Up/Down Stairs - Progress: Goal set today Pt will Perform Home Exercise Program: Independently PT Goal: Perform Home Exercise Program - Progress: Goal set today  Visit Information  Last PT Received On: 07/17/12 Assistance Needed: +1    Subjective Data  Subjective: Patient reports feeling lightheaded in sitting. Patient Stated Goal: To return home soon.   Prior Functioning  Home Living Lives With: Spouse Available Help at Discharge: Family;Available 24 hours/day (Mother and niece are helping) Type of Home: House Home Access: Stairs to enter Secretary/administrator of Steps: 3 Entrance Stairs-Rails: None Home Layout: Two level;Bed/bath upstairs Alternate Level Stairs-Number of Steps: flight Alternate Level Stairs-Rails: Left Bathroom Shower/Tub: Engineer, manufacturing systems: Standard Bathroom Accessibility: Yes How Accessible: Accessible via walker Home Adaptive Equipment: None Prior Function Level of Independence: Independent Able to Take Stairs?:  Yes Driving: Yes Vocation: Full time employment 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 Communication  Communication: No difficulties    Cognition  Cognition Overall Cognitive Status: Appears within functional limits for tasks assessed/performed Arousal/Alertness: Awake/alert Orientation Level: Oriented X4 / Intact Behavior During Session: [facility] for tasks performed    Extremity/Trunk Assessment Right Upper Extremity Assessment RUE ROM/Strength/Tone: Within functional levels RUE Sensation: WFL - Light Touch Left Upper Extremity Assessment LUE ROM/Strength/Tone: Within functional levels LUE Sensation: WFL - Light Touch Right Lower Extremity Assessment RLE ROM/Strength/Tone: WFL for tasks assessed RLE Sensation: WFL - Light Touch Left Lower Extremity Assessment LLE ROM/Strength/Tone: Deficits;Unable to fully assess;Due to pain;Due to precautions LLE ROM/Strength/Tone Deficits: Able to assist moving LLE off of bed.   Balance Balance Balance Assessed: Yes Static Sitting Balance Static Sitting - Balance Support: No upper extremity supported;Feet supported Static Sitting - Level of Assistance: 5: Stand by assistance Static Sitting - Comment/# of Minutes: 6  End of Session PT - End of Session Activity Tolerance: Patient limited by pain;Patient limited by fatigue;Treatment limited secondary to medical complications (Comment) (Patient became lightheaded) Patient left: in bed;with call bell/phone within reach;with family/visitor present Nurse Communication: Mobility status  GP Functional Assessment Tool Used: Clinical judgement Functional Limitation: Mobility: Walking and moving around Mobility: Walking and Moving Around Current Status (W0981): At least 20 percent but less than 40 percent impaired, limited or restricted Mobility: Walking and Moving Around Goal Status [phone]): At least 1 percent but less than 20 percent impaired, limited or restricted   Vena Austria 07/17/2012, 1:00 PM Durenda Hurt. Renaldo Fiddler, [facility] Pager [phone]

## 2012-07-17 NOTE — Preoperative (Signed)
Beta Blockers   Reason not to administer Beta Blockers:Not Applicable 

## 2012-07-17 NOTE — Progress Notes (Signed)
[facility]  Patient Status: New  [facility] is providing the following services: PT - referral from MD office - thank you  If patient discharges after hours, please call [phone].   Jodene Nam 07/17/2012, 1:59 PM

## 2012-07-17 NOTE — Interval H&P Note (Signed)
History and Physical Interval Note:  07/17/2012 7:01 AM  [name]  has presented today for surgery, with the diagnosis of LEFT HIP AVASCULAR NECROSIS/COLLAPSE  The various methods of treatment have been discussed with the patient and family. After consideration of risks, benefits and other options for treatment, the patient has consented to  Procedure(s): TOTAL HIP ARTHROPLASTY (Left) as a surgical intervention .  The patient's history has been reviewed, patient examined, no change in status, stable for surgery.  I have reviewed the patient's chart and labs.  Questions were answered to the patient's satisfaction.     Dorothy [name]

## 2012-07-17 NOTE — Op Note (Signed)
OPERATIVE REPORT    DATE OF PROCEDURE:  07/17/2012       PREOPERATIVE DIAGNOSIS:  LEFT HIP AVASCULAR NECROSIS/COLLAPSE                                                          POSTOPERATIVE DIAGNOSIS:  LEFT HIP AVASCULAR NECROSIS/COLLAPSE                                                           PROCEDURE:  L total hip arthroplasty using a 50 mm DePuy Pinnacle  Cup, [employer], 10-degree polyethylene liner index superior  and posterior, a +3 32 mm ceramic head, a 22x17x160x42 SROM stem, 22Dsm Cone   SURGEON: ROWAN,FRANK J    ASSISTANT:   Mauricia Area, PA-C  (present throughout entire procedure and necessary for timely completion of the procedure)   ANESTHESIA: General BLOOD LOSS: 200 FLUID REPLACEMENT: 1500 crystalloid DRAINS: Foley Catheter URINE OUTPUT: 300cc COMPLICATIONS: none    INDICATIONS FOR PROCEDURE: A 62 y.o. year-old With  LEFT HIP AVASCULAR NECROSIS/COLLAPSE   for 1 years, x-rays show crescent sign with collapse. Despite conservative measures with observation, anti-inflammatory medicine, narcotics, use of a cane, has severe unremitting pain and can ambulate only a few blocks before resting.  Pain also wakes the patient at night and interferes with chores Patient desires elective left total hip arthroplasty to decrease pain and increase function. The risks, benefits, and alternatives were discussed at length including but not limited to the risks of infection, bleeding, nerve injury, stiffness, blood clots, the need for revision surgery, cardiopulmonary complications, among others, and they were willing to proceed.y have been discussed. Questions answered.     PROCEDURE IN DETAIL: The patient was identified by armband,  received preoperative IV antibiotics in the holding area at [facility], taken to the operating room , appropriate anesthetic monitors  were attached and general endotracheal anesthesia induced. Foley catheter was inserted. Pt was rolled  into the right lateral decubitus position and fixed there with a Stulberg Mark II pelvic clamp and the left lower extremity was then prepped and draped  in the usual sterile fashion from the ankle to the hemipelvis. A time-out  procedure was performed. The skin along the lateral hip and thigh  infiltrated with 10 mL of 0.5% Marcaine and epinephrine solution. We  then made a posterolateral approach to the hip. With a #10 blade, 12 cm  incision through skin and subcutaneous tissue down to the level of the  IT band. Small bleeders were identified and cauterized. IT band cut in  line with skin incision exposing the greater trochanter. A Cobra retractor was placed between the gluteus minimus and the superior hip joint capsule, and a spiked Cobra between the quadratus femoris and the inferior hip joint capsule. This isolated the short  external rotators and piriformis tendons. These were tagged with a #2 Ethibond  suture and cut off their insertion on the intertrochanteric crest. The posterior  capsule was then developed into an acetabular-based flap from Posterior Superior off of the acetabulum out over the femoral neck and back posterior inferior to  the acetabular rim. This flap was tagged with two #2 Ethibond sutures and retracted protecting the sciatic nerve. This exposed the arthritic femoral head and osteophytes. The hip was then flexed and internally rotated, dislocating the femoral head and a standard neck cut performed 1 fingerbreadth above the lesser trochanter.  A spiked Cobra was placed in the cotyloid notch and a Hohmann retractor was then used to lever the femur anteriorly off of the anterior pelvic column. A posterior-inferior wing retractor was placed at the junction of the acetabulum and the ischium completing the acetabular exposure.We then removed the peripheral osteophytes and labrum from the acetabulum. We then reamed the acetabulum up to 49 mm with basket reamers obtaining good coverage in  all quadrants, irrigated out with normal  saline solution and hammered into place a 50 mm pinnacle cup in 45  degrees of abduction and about 20 degrees of anteversion. More  peripheral osteophytes removed and a trial 10-degree liner placed with the  index superior-posterior. The hip was then flexed and internally rotated exposing the  proximal femur, which was entered with the initiating reamer followed by  the axial reamers up to a 17.5 mm full depth and 18 mm partial depth. We then conically reamed to 2D to the correct depth for a 42 base neck. The calcar was milled to 202D small. A trial cone and stem was inserted in the 25 degrees anteversion, with a +3 32mm trial head. Trial reduction was then performed and excellent stability was noted with at 90 of flexion with 75 of internal rotation and then full extension with maximal external rotation. The hip could not be dislocated in full extension. The knee could easily flex  to about 130 degrees. We also stretched the abductors at this point,  because of the preexisting adductor contractures. All trial components  were then removed. The acetabulum was irrigated out with normal saline  solution. A titanium [facility] was then screwed into place  followed by a 10-degree polyethylene liner index superior-posterior. On  the femoral side a 22Dsm ZTT1 cone was hammered into place, followed by a 22x17x42x160 SROM stem in 25 degrees of anteversion. At this point, a +3 32 mm ceramic head was  hammered on the stem. The hip was reduced. We checked our stability  one more time and found to be excellent. The wound was once again  thoroughly irrigated out with normal saline solution pulse lavage. The  capsular flap and short external rotators were repaired back to the  intertrochanteric crest through drill holes with a #2 Ethibond suture.  The IT band was closed with running 1 Vicryl suture. The subcutaneous  tissue with 0 and 2-0 undyed Vicryl suture  and the skin with running  interlocking 3-0 nylon suture. Dressing of Xeroform and Mepilex was  then applied. The patient was then unclamped, rolled supine, awaken extubated and taken to recovery room without difficulty in stable condition.   ROWAN,FRANK J 07/17/2012, 8:57 AM

## 2012-07-17 NOTE — Progress Notes (Signed)
Physical Therapy Treatment Patient Details Name: Dorothy [name] MRN: [id number] DOB: July 04, 1950 Today's Date: 07/17/2012 Time: 1191-4782 PT Time Calculation (min): 35 min  PT Assessment / Plan / Recommendation Comments on Treatment Session  Patient s/p L THA with posterior precautions. Patient evaled this morning (day of surgery) and able to progress very well and tolerate 2 sessions of PT today. Patient eager to progress eith therapy and I believe she will. Patient does have a flight of steps to get to bedroom and will need to complete before DC home. Anticipate DC late tueday or early wednesday based on progress    Follow Up Recommendations  Home health PT;Supervision/Assistance - 24 hour     Does the patient have the potential to tolerate intense rehabilitation     Barriers to Discharge None      Equipment Recommendations  Rolling walker with 5" wheels    Recommendations for Other Services    Frequency 7X/week   Plan Discharge plan remains appropriate;Frequency remains appropriate    Precautions / Restrictions Precautions Precautions: Posterior Hip Precaution Booklet Issued: Yes (comment) Precaution Comments: Reviewed precautions with patient and husband, and provided handout. Restrictions Weight Bearing Restrictions: Yes LLE Weight Bearing: Weight bearing as tolerated   Pertinent Vitals/Pain     Mobility  Bed Mobility Bed Mobility: Supine to Sit;Sitting - Scoot to Edge of Bed;Sit to Supine Supine to Sit: 5: Supervision Sitting - Scoot to Edge of Bed: 5: Supervision  Details for Bed Mobility Assistance: Verbal cues for technique to maintain hip precautions. Transfers Transfers: Sit to Stand;Stand to Sit Sit to Stand: 4: Min assist;With upper extremity assist;From bed Stand to Sit: 4: Min guard;To chair/3-in-1;With upper extremity assist;With armrests Details for Transfer Assistance: A to ensure balance. Cues for sequency and  technique Ambulation/Gait Ambulation/Gait Assistance: 4: Min guard Ambulation Distance (Feet): 80 Feet Assistive device: Rolling walker Ambulation/Gait Assistance Details: Cues for gait sequence, management of RW and WBing status Gait Pattern: Step-to pattern;Decreased step length - right;Decreased step length - left    Exercises Total Joint Exercises Ankle Circles/Pumps: AROM;Both;15 reps;Supine Quad Sets: AROM;Left;10 reps Gluteal Sets: AROM;Left;10 reps Heel Slides: AAROM;Left;10 reps Hip ABduction/ADduction: AAROM;Left;10 reps   PT Diagnosis: Difficulty walking;Acute pain  PT Problem List: Decreased strength;Decreased range of motion;Decreased activity tolerance;Decreased balance;Decreased mobility;Decreased knowledge of use of DME;Decreased knowledge of precautions;Pain PT Treatment Interventions: DME instruction;Gait training;Stair training;Functional mobility training;Therapeutic exercise;Patient/family education   PT Goals Acute Rehab PT Goals PT Goal Formulation: With patient/family Time For Goal Achievement: 07/24/12 Potential to Achieve Goals: Good Pt will go Supine/Side to Sit: with supervision;with HOB 0 degrees PT Goal: Supine/Side to Sit - Progress: Progressing toward goal Pt will go Sit to Supine/Side: with supervision;with HOB 0 degrees PT Goal: Sit to Supine/Side - Progress: Goal set today Pt will go Sit to Stand: with supervision;with upper extremity assist PT Goal: Sit to Stand - Progress: Progressing toward goal Pt will Ambulate: 51 - 150 feet;with supervision;with rolling walker PT Goal: Ambulate - Progress: Progressing toward goal Pt will Go Up / Down Stairs: 6-9 stairs;with min assist;with rail(s);with least restrictive assistive device PT Goal: Up/Down Stairs - Progress: Goal set today Pt will Perform Home Exercise Program: Independently PT Goal: Perform Home Exercise Program - Progress: Progressing toward goal  Visit Information  Last PT Received On:  07/17/12 Assistance Needed: +1    Subjective Data  Subjective: Patient reports feeling lightheaded in sitting. Patient Stated Goal: To return home soon.   Cognition  Cognition Overall Cognitive Status: Appears  within functional limits for tasks assessed/performed Arousal/Alertness: Awake/alert Orientation Level: Appears intact for tasks assessed Behavior During Session: [facility] for tasks performed    Balance    End of Session PT - End of Session Equipment Utilized During Treatment: Gait belt Activity Tolerance: Patient tolerated treatment well Patient left: in chair;with call bell/phone within reach Nurse Communication: Mobility status   GP    Fredrich Birks 07/17/2012, 3:12 PM  07/17/2012 Fredrich Birks PTA [phone] pager [phone] office

## 2012-07-17 NOTE — Progress Notes (Signed)
UR COMPLETED  

## 2012-07-17 NOTE — Plan of Care (Signed)
Problem: Consults Goal: Diagnosis- Total Joint Replacement Primary Total Hip LEFT     

## 2012-07-17 NOTE — Anesthesia Procedure Notes (Signed)
Procedure Name: Intubation Date/Time: 07/17/2012 7:36 AM Performed by: Rogelia Boga Pre-anesthesia Checklist: Patient identified, Emergency Drugs available, Suction available, Patient being monitored and Timeout performed Patient Re-evaluated:Patient Re-evaluated prior to inductionOxygen Delivery Method: Circle system utilized Preoxygenation: Pre-oxygenation with 100% oxygen Intubation Type: IV induction Ventilation: Mask ventilation without difficulty Laryngoscope Size: Mac and 3 Grade View: Grade I Tube type: Oral Tube size: 7.0 mm Number of attempts: 1 Airway Equipment and Method: Stylet Placement Confirmation: ETT inserted through vocal cords under direct vision,  breath sounds checked- equal and bilateral and positive ETCO2 Secured at: 22 cm Tube secured with: Tape Dental Injury: Teeth and Oropharynx as per pre-operative assessment

## 2012-07-18 ENCOUNTER — Encounter (HOSPITAL_COMMUNITY): Payer: Self-pay | Admitting: General Practice

## 2012-07-18 DIAGNOSIS — M87059 Idiopathic aseptic necrosis of unspecified femur: Secondary | ICD-10-CM | POA: Diagnosis present

## 2012-07-18 LAB — CBC
HCT: 24.1 % — ABNORMAL LOW (ref 36.0–46.0)
Hemoglobin: 8.6 g/dL — ABNORMAL LOW (ref 12.0–15.0)
MCH: 33.6 pg (ref 26.0–34.0)
MCHC: 35.7 g/dL (ref 30.0–36.0)
MCV: 94.1 fL (ref 78.0–100.0)
Platelets: 180 10*3/uL (ref 150–400)
RBC: 2.56 MIL/uL — ABNORMAL LOW (ref 3.87–5.11)
RDW: 12.7 % (ref 11.5–15.5)
WBC: 8.6 10*3/uL (ref 4.0–10.5)

## 2012-07-18 LAB — BASIC METABOLIC PANEL
BUN: 6 mg/dL (ref 6–23)
CO2: 27 mEq/L (ref 19–32)
Calcium: 7.9 mg/dL — ABNORMAL LOW (ref 8.4–10.5)
Chloride: 106 mEq/L (ref 96–112)
Creatinine, Ser: 0.74 mg/dL (ref 0.50–1.10)
GFR calc Af Amer: >90 mL/min (ref >90)
GFR calc non Af Amer: 90 mL/min — ABNORMAL LOW (ref >90)
Glucose, Bld: 85 mg/dL (ref 70–99)
Potassium: 4 mEq/L (ref 3.5–5.1)
Sodium: 137 mEq/L (ref 135–145)

## 2012-07-18 MED ORDER — METHOCARBAMOL 500 MG PO TABS: 500.0000 mg | ORAL_TABLET | Freq: Four times a day (QID) | ORAL | Status: DC | PRN

## 2012-07-18 MED ORDER — ASPIRIN 325 MG PO TBEC: 325.0000 mg | DELAYED_RELEASE_TABLET | Freq: Two times a day (BID) | ORAL | Status: DC

## 2012-07-18 MED ORDER — OXYCODONE HCL 5 MG PO TABS: 5.0000 mg | ORAL_TABLET | ORAL | Status: DC | PRN

## 2012-07-18 NOTE — Progress Notes (Signed)
Physical Therapy Treatment Patient Details Name: Dorothy [name] MRN: [id number] DOB: 1950-12-12 Today's Date: 07/18/2012 Time: 4098-1191 PT Time Calculation (min): 29 min  PT Assessment / Plan / Recommendation Comments on Treatment Session  Patient s/p L THA with posterior precautions. Patient able to practice stairs two ways without difficulty. Anticpate discharge today if husband can get walkway cleared at home. Reveiwed precautions, HEP and stairs with husband    Follow Up Recommendations  Home health PT;Supervision/Assistance - 24 hour     Does the patient have the potential to tolerate intense rehabilitation     Barriers to Discharge        Equipment Recommendations  Rolling walker with 5" wheels    Recommendations for Other Services    Frequency 7X/week   Plan Discharge plan remains appropriate;Frequency remains appropriate    Precautions / Restrictions Precautions Precautions: Posterior Hip Precaution Booklet Issued: Yes (comment) Precaution Comments: Patient able to recall all precautions and maintained with limited cues with mobility Restrictions Weight Bearing Restrictions: Yes LLE Weight Bearing: Weight bearing as tolerated   Pertinent Vitals/Pain     Mobility  Bed Mobility Supine to Sit: 6: Modified independent (Device/Increase time) Sitting - Scoot to Edge of Bed: 6: Modified independent (Device/Increase time) Details for Bed Mobility Assistance: Verbal cues for technique to maintain hip precautions. Transfers Sit to Stand: 5: Supervision;With upper extremity assist;From chair/3-in-1;From bed Stand to Sit: 5: Supervision;To chair/3-in-1 Details for Transfer Assistance:  Cues for sequency and technique Ambulation/Gait Ambulation/Gait Assistance: 5: Supervision Ambulation Distance (Feet): 100 Feet Assistive device: Rolling walker Ambulation/Gait Assistance Details: Patient working towards step through gait pattern.  Gait Pattern: Step-to  pattern;Step-through pattern;Decreased step length - right;Decreased step length - left;Decreased stride length Stairs: Yes Stairs Assistance: 4: Min assist;4: Min guard Stairs Assistance Details (indicate cue type and reason): Patient able to practice step backwards with RW x4 steps with min A and cues for technique and 6 steps sideways with Minguard A and cueing. Patient has 13 steps to get to bedroom and believe she will be able to ascend/descend without difficulty Stair Management Technique: No rails;Step to pattern;Sideways;Backwards;One rail Left Number of Stairs: 4 (6)    Exercises Total Joint Exercises Ankle Circles/Pumps: AROM;Both;15 reps;Supine Quad Sets: AROM;Left;10 reps Gluteal Sets: AROM;Left;10 reps Heel Slides: AAROM;Left;10 reps Hip ABduction/ADduction: AAROM;Left;10 reps   PT Diagnosis:    PT Problem List:   PT Treatment Interventions:     PT Goals Acute Rehab PT Goals PT Goal: Supine/Side to Sit - Progress: Met PT Goal: Sit to Stand - Progress: Met PT Goal: Ambulate - Progress: Met PT Goal: Up/Down Stairs - Progress: Met PT Goal: Perform Home Exercise Program - Progress: Progressing toward goal  Visit Information  Last PT Received On: 07/18/12 Assistance Needed: +1    Subjective Data      Cognition  Cognition Overall Cognitive Status: Appears within functional limits for tasks assessed/performed Arousal/Alertness: Awake/alert Orientation Level: Appears intact for tasks assessed Behavior During Session: [facility] for tasks performed    Balance     End of Session PT - End of Session Equipment Utilized During Treatment: Gait belt Activity Tolerance: Patient tolerated treatment well Patient left: in chair;with call bell/phone within reach Nurse Communication: Mobility status   GP     Fredrich Birks 07/18/2012, 11:50 AM 07/18/2012 Fredrich Birks PTA [phone] pager [phone] office

## 2012-07-18 NOTE — Progress Notes (Signed)
Physical Therapy Treatment Patient Details Name: Dorothy [name] MRN: [id number] DOB: 01-09-1951 Today's Date: 07/18/2012 Time: 1191-4782 PT Time Calculation (min): 40 min  PT Assessment / Plan / Recommendation Comments on Treatment Session  Patient s/p L THA with posterior precautions. Patient able to ambulate to gym and back however became nauseated and unable to attempt steps this morning. Will attempt later on today as patient can tolerate    Follow Up Recommendations  Home health PT;Supervision/Assistance - 24 hour     Does the patient have the potential to tolerate intense rehabilitation     Barriers to Discharge        Equipment Recommendations  Rolling walker with 5" wheels    Recommendations for Other Services    Frequency 7X/week   Plan Discharge plan remains appropriate;Frequency remains appropriate    Precautions / Restrictions Precautions Precautions: Posterior Hip Precaution Booklet Issued: Yes (comment) Restrictions Weight Bearing Restrictions: Yes LLE Weight Bearing: Weight bearing as tolerated   Pertinent Vitals/Pain No complaints of hip pain this session. Complaints of nausea    Mobility  Bed Mobility Supine to Sit: 5: Supervision Sitting - Scoot to Edge of Bed: 5: Supervision Details for Bed Mobility Assistance: Verbal cues for technique to maintain hip precautions. Transfers Sit to Stand: 4: Min guard;From chair/3-in-1;From bed;With upper extremity assist Stand to Sit: 4: Min guard;With upper extremity assist;To chair/3-in-1 Details for Transfer Assistance:  Cues for sequency and technique Ambulation/Gait Ambulation/Gait Assistance: 4: Min guard Ambulation Distance (Feet): 150 Feet Assistive device: Rolling walker Ambulation/Gait Assistance Details: Cues for posture and heel strike Gait Pattern: Step-to pattern;Decreased step length - right;Decreased step length - left    Exercises Total Joint Exercises Ankle Circles/Pumps: AROM;Both;15  reps;Supine Quad Sets: AROM;Left;10 reps Gluteal Sets: AROM;Left;10 reps Heel Slides: AAROM;Left;10 reps Hip ABduction/ADduction: AAROM;Left;10 reps   PT Diagnosis:    PT Problem List:   PT Treatment Interventions:     PT Goals Acute Rehab PT Goals PT Goal: Supine/Side to Sit - Progress: Progressing toward goal PT Goal: Sit to Stand - Progress: Progressing toward goal PT Goal: Ambulate - Progress: Progressing toward goal PT Goal: Perform Home Exercise Program - Progress: Progressing toward goal  Visit Information  Last PT Received On: 07/18/12 Assistance Needed: +1    Subjective Data      Cognition  Cognition Overall Cognitive Status: Appears within functional limits for tasks assessed/performed Arousal/Alertness: Awake/alert Orientation Level: Appears intact for tasks assessed Behavior During Session: [facility] for tasks performed    Balance     End of Session PT - End of Session Equipment Utilized During Treatment: Gait belt Activity Tolerance: Patient tolerated treatment well Patient left: in chair;with call bell/phone within reach Nurse Communication: Mobility status   GP     Fredrich Birks 07/18/2012, 10:56 AM 07/18/2012 Fredrich Birks PTA [phone] pager [phone] office

## 2012-07-18 NOTE — Progress Notes (Signed)
Patient ID: Dorothy [name], female   DOB: 11/04/1950, 62 y.o.   MRN: [id number] PATIENT ID: Dorothy [name]  MRN: [id number]  DOB/AGE:  1950-10-26 / 62 y.o.  1 Day Post-Op Procedure(s) (LRB): TOTAL HIP ARTHROPLASTY (Left)    PROGRESS NOTE Subjective: Patient is alert, oriented,no Nausea, no Vomiting, yes passing gas, no Bowel Movement. Taking PO well. Denies SOB, Chest or Calf Pain. Using Incentive Spirometer, PAS in place. Ambulate WBAT, walked in hallway on the afternoon of surgery Patient reports pain as 2 on 0-10 scale  .    Objective: Vital signs in last 24 hours: Filed Vitals:   07/17/12 2000 07/17/12 2051 07/17/12 2323 07/18/12 0300  BP:  97/53  91/51  Pulse:  80  75  Temp:  97.9 F (36.6 C)  98 F (36.7 C)  TempSrc:  Oral  Oral  Resp: 18 18 18 18   SpO2: 100% 100% 100% 100%      Intake/Output from previous day: I/O last 3 completed shifts: In: 1500 [I.V.:1500] Out: 3550 [Urine:3350; Blood:200]   Intake/Output this shift:     LABORATORY DATA:  Recent Labs  07/18/12 0613  WBC 8.6  HGB 8.6*  HCT 24.1*  PLT 180  NA 137  K 4.0  CL 106  CO2 27  BUN 6  CREATININE 0.74  GLUCOSE 85  CALCIUM 7.9*    Examination: Neurologically intact ABD soft Neurovascular intact Sensation intact distally Intact pulses distally Dorsiflexion/Plantar flexion intact Incision: no drainage No cellulitis present Compartment soft} XR AP&Lat of hip shows well placed\fixed THA  Assessment:   1 Day Post-Op Procedure(s) (LRB): TOTAL HIP ARTHROPLASTY (Left) ADDITIONAL DIAGNOSIS:    Plan: PT/OT WBAT, THA  posterior precautions  DVT Prophylaxis: SCDx72 hrs, ASA 325 mg BID x 2 weeks  DISCHARGE PLAN: Home, today if patient passes physical therapy  DISCHARGE NEEDS: HHPT, HHRN, CPM, Walker and 3-in-1 comode seat

## 2012-07-18 NOTE — Discharge Summary (Signed)
Patient ID: Dorothy [name] MRN: [id number] DOB/AGE: 08-22-1950 62 y.o.  Admit date: 07/17/2012 Discharge date: 07/18/2012  Admission Diagnoses:  Principal Problem:   Avascular necrosis of hip   Discharge Diagnoses:  Same  Past Medical History  Diagnosis Date  . Thyroid disease   . Hyperlipidemia   . PONV (postoperative nausea and vomiting)   . Hypothyroidism   . Arthritis   . Osteopenia   . 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 Bruises easily     more on hands and arms    Surgeries: Procedure(s): Left TOTAL HIP ARTHROPLASTY on 07/17/2012   Consultants:    Discharged Condition: Improved  Hospital Course: Dorothy [name] is an 62 y.o. female who was admitted 07/17/2012 for operative treatment ofAvascular necrosis of hip. Patient has severe unremitting pain that affects sleep, daily activities, and work/hobbies. After pre-op clearance the patient was taken to the operating room on 07/17/2012 and underwent  Procedure(s): TOTAL HIP ARTHROPLASTY.    Patient was given perioperative antibiotics: Anti-infectives   Start     Dose/Rate Route Frequency Ordered Stop   07/17/12 1126  valACYclovir (VALTREX) tablet 500 mg     500 mg Oral Daily PRN 07/17/12 1126     07/17/12 1000  vancomycin (VANCOCIN) 1,000 mg in sodium chloride 0.9 % 250 mL IVPB  Status:  Discontinued     1,000 mg 250 mL/hr over 60 Minutes Intravenous Every 12 hours 07/17/12 0759 07/17/12 1113   07/17/12 0600  ceFAZolin (ANCEF) IVPB 2 g/50 mL premix  Status:  Discontinued     2 g 100 mL/hr over 30 Minutes Intravenous On call to O.R. 07/16/12 1328 07/17/12 1113       Patient was given sequential compression devices, early ambulation, and chemoprophylaxis to prevent DVT.  Patient benefited maximally from hospital stay and there were no complications. Patient walked in hallway on the day of surgery  Recent vital signs: Patient Vitals for the past 24 hrs:  BP Temp Temp src Pulse Resp  SpO2  07/18/12 0300 91/51 mmHg 98 F (36.7 C) Oral 75 18 100 %  07/17/12 2323 - - - - 18 100 %  07/17/12 2051 97/53 mmHg 97.9 F (36.6 C) Oral 80 18 100 %  07/17/12 2000 - - - - 18 100 %  07/17/12 1350 82/69 mmHg 97.7 F (36.5 C) - 67 18 100 %  07/17/12 1115 100/57 mmHg - - - - -  07/17/12 1110 88/60 mmHg 97.4 F (36.3 C) Oral 64 18 100 %  07/17/12 1045 - - - 70 10 100 %  07/17/12 1034 97/60 mmHg - - - - -  07/17/12 1030 - - - 72 12 100 %  07/17/12 1019 99/60 mmHg - - - - -  07/17/12 1015 - - - 61 9 100 %  07/17/12 1005 96/61 mmHg - - - - -  07/17/12 1000 - - - 62 11 100 %  07/17/12 0949 105/62 mmHg - - - - -  07/17/12 0945 - - - 63 10 100 %  07/17/12 0930 - - - 66 13 100 %  07/17/12 0920 102/59 mmHg - - 81 19 100 %     Recent laboratory studies:  Recent Labs  07/18/12 0613  WBC 8.6  HGB 8.6*  HCT 24.1*  PLT 180  NA 137  K 4.0  CL 106  CO2 27  BUN 6  CREATININE 0.74  GLUCOSE 85  CALCIUM 7.9*  Discharge Medications:     Medication List    TAKE these medications       aspirin 325 MG EC tablet  Take 1 tablet (325 mg total) by mouth 2 (two) times daily.     aspirin EC 81 MG tablet  Take 81 mg by mouth daily.     CALCIUM 600+D 600-400 MG-UNIT per tablet  Generic drug:  Calcium Carbonate-Vitamin D  Take 2 tablets by mouth daily.     docusate sodium 100 MG capsule  Commonly known as:  COLACE  Take 100 mg by mouth every 6 (six) hours as needed for constipation.     levothyroxine 100 MCG tablet  Commonly known as:  SYNTHROID, LEVOTHROID  Take 100 mcg by mouth daily.     methocarbamol 500 MG tablet  Commonly known as:  ROBAXIN  Take 1 tablet (500 mg total) by mouth every 6 (six) hours as needed.     oxyCODONE 5 MG immediate release tablet  Commonly known as:  Oxy IR/ROXICODONE  Take 1-2 tablets (5-10 mg total) by mouth every 3 (three) hours as needed.     PRESCRIPTION MEDICATION  Apply 1 application topically 2 (two) times daily. Compounded  medication, ketm 10%, baclofen 2%, bupivacaine 1%, cyclobenzaprine 2%, gabapentin 6%, orphen 5%, pent 3%.  Can use 1-2 grams.     raloxifene 60 MG tablet  Commonly known as:  EVISTA  Take 60 mg by mouth daily.     saccharomyces boulardii 250 MG capsule  Commonly known as:  FLORASTOR  Take 250 mg by mouth daily.     simvastatin 20 MG tablet  Commonly known as:  ZOCOR  Take 20 mg by mouth every evening.     valACYclovir 500 MG tablet  Commonly known as:  VALTREX  Take 500 mg by mouth daily as needed. Fever blister/cold sore.     Vitamin D 2000 UNITS Caps  Take 1 capsule by mouth daily.        Diagnostic Studies: Dg Chest 2 View  07/13/2012  *RADIOLOGY REPORT*  Clinical Data: Preop for left hip arthroplasty  CHEST - 2 VIEW  Comparison: 10/15/2009  Findings: Cardiomediastinal silhouette is stable.  No acute infiltrate or pleural effusion.  No pulmonary edema.  Bony thorax is stable.  IMPRESSION: No active disease.  No significant change.   Original Report Authenticated By: Natasha Mead, M.D.    Dg Pelvis Portable  07/17/2012  *RADIOLOGY REPORT*  Clinical Data:  Postop left hip total arthroplasty  PORTABLE PELVIS, PORTABLE LEFT HIP - 1 VIEW  Comparison: Preoperative MRI 06/27/2012  Findings: Single frontal cross-table lateral views demonstrate interval surgical changes of left hip arthroplasty.  The femoral head component appears well seated within the acetabular component. No evidence of immediate hardware complication or periprosthetic fracture.  Distal aspect of the femoral component is incompletely imaged on the frontal view.  The remainder of the bony pelvis is intact.  The changes of avascular necrosis in the right femoral head are again noted.  IMPRESSION: Interval surgical changes of left hip arthroplasty without evidence of immediate complication as above.  Right femoral head avascular necrosis.   Original Report Authenticated By: Malachy Moan, M.D.    Mr Hip Left Wo  Contrast  06/28/2012  *RADIOLOGY REPORT*  Clinical Data: Intermittent left hip pain since August, 2013. Question avascular necrosis.  MRI OF THE LEFT HIP WITHOUT CONTRAST  Technique:  Multiplanar, multisequence MR imaging was performed. No intravenous contrast was administered.  Comparison: None.  Findings:  The patient has avascular necrosis of the femoral heads bilaterally.  Changes appear worse on the left where there is mild subchondral collapse.  Extensive edema is seen in the left femoral head and neck.  Bone marrow signal is otherwise unremarkable. Moderate left hip degenerative change is present.  There is no evidence of bursitis.  No muscle or tendon tear or strain is seen. No mass is identified.  Imaged intrapelvic contents are unremarkable.  IMPRESSION: Avascular necrosis of the femoral heads bilaterally, worse on the left where there is mild subchondral collapse and extensive edema in the left femoral head and neck.  Moderate left hip osteoarthritis also identified.   Original Report Authenticated By: Holley Dexter, M.D.    Dg Hip Portable 1 View Left  07/17/2012  *RADIOLOGY REPORT*  Clinical Data:  Postop left hip total arthroplasty  PORTABLE PELVIS, PORTABLE LEFT HIP - 1 VIEW  Comparison: Preoperative MRI 06/27/2012  Findings: Single frontal cross-table lateral views demonstrate interval surgical changes of left hip arthroplasty.  The femoral head component appears well seated within the acetabular component. No evidence of immediate hardware complication or periprosthetic fracture.  Distal aspect of the femoral component is incompletely imaged on the frontal view.  The remainder of the bony pelvis is intact.  The changes of avascular necrosis in the right femoral head are again noted.  IMPRESSION: Interval surgical changes of left hip arthroplasty without evidence of immediate complication as above.  Right femoral head avascular necrosis.   Original Report Authenticated By: Malachy Moan, M.D.     Mm Digital Screening  06/21/2012  *RADIOLOGY REPORT*  Clinical Data: Screening.  DIGITAL BILATERAL SCREENING MAMMOGRAM WITH CAD DIGITAL BREAST TOMOSYNTHESIS  Digital breast tomosynthesis images are acquired in two projections.  These images are reviewed in combination with the digital mammogram, confirming the findings below.  Comparison:  09/21/2005  FINDINGS:  ACR Breast Density Category 2: There are scattered fibroglandular densities.  There is no suspicious dominant mass, architectural distortion, or calcification to suggest malignancy.  Images were processed with CAD.  IMPRESSION: No mammographic evidence of malignancy.  A result letter of this screening mammogram will be mailed directly to the patient.  RECOMMENDATION: Screening mammogram in one year. (Code:SM-B-01Y)  BI-RADS CATEGORY 1:  Negative.   Original Report Authenticated By: Cain Saupe, M.D.     Disposition: Final discharge disposition not confirmed      Discharge Orders   Future Appointments Provider Department Dept Phone   10/18/2012 1:30 PM Kendrick Ranch, MD [facility] [phone]   Future Orders Complete By Expires     Call MD / Call 911  As directed     Comments:      If you experience chest pain or shortness of breath, CALL 911 and be transported to the hospital emergency room.  If you develope a fever above 101 F, pus (white drainage) or increased drainage or redness at the wound, or calf pain, call your surgeon's office.    Change dressing  As directed     Comments:      You may change your dressing on POD #5    Constipation Prevention  As directed     Comments:      Drink plenty of fluids.  Prune juice may be helpful.  You may use a stool softener, such as Colace (over the counter) 100 mg twice a day.  Use MiraLax (over the counter) for constipation as needed.    Diet - low sodium heart  healthy  As directed     Driving restrictions  As directed     Comments:      No driving for 2  weeks    Follow the hip precautions as taught in Physical Therapy  As directed     Increase activity slowly as tolerated  As directed     Patient may shower  As directed     Comments:      You may shower without a dressing once there is no drainage.  Do not wash over the wound.  If drainage remains, cover wound with plastic wrap and then shower.       Follow-up Information   Follow up with Nestor Lewandowsky, MD. Schedule an appointment as soon as possible for a visit in 14 days.   Contact information:   [address] [place] [address] [phone]        Signed: Nestor Lewandowsky 07/18/2012, 7:49 AM

## 2012-07-18 NOTE — Progress Notes (Signed)
CARE MANAGEMENT NOTE 07/18/2012  Patient:  BRYAH, OCHELTREE   Account Number:  [id number]  Date Initiated:  07/18/2012  Documentation initiated by:  Vance Peper  Subjective/Objective Assessment:   62 yr old female s/p left total hip arthroplasty.     Action/Plan:   patient preoperatively setup with [facility], no changes.  rolling walker and 3in1 have been delivered.   Anticipated DC Date:  07/18/2012   Anticipated DC Plan:  HOME W HOME HEALTH SERVICES      DC Planning Services  CM consult      [facility] Choice  HOME HEALTH   Choice offered to / List presented to:  C-1 Patient        HH arranged  HH-2 PT      [facility] agency  [facility]   Status of service:  Completed, signed off Medicare Important Message given?   (If response is "NO", the following Medicare IM given date fields will be blank) Date Medicare IM given:   Date Additional Medicare IM given:    Discharge Disposition:  HOME W HOME HEALTH SERVICES  Per UR Regulation:    If discussed at Long Length of Stay Meetings, dates discussed:    Comments:

## 2012-07-18 NOTE — Evaluation (Signed)
Occupational Therapy Evaluation Patient Details Name: Dorothy [name] MRN: [id number] DOB: 1950/07/11 Today's Date: 07/18/2012 Time: 7829-5621 OT Time Calculation (min): 29 min  OT Assessment / Plan / Recommendation Clinical Impression  Pt referred to OT services following L THA. Pt at sup - set up level with UB ADLs, min A with LB ADLs and sup with ADL/functional mobility. pt's husband will be at home to assist her. No further acute or follow up OT needed at this time, all education completed and OT will sign off    OT Assessment  Patient does not need any further OT services    Follow Up Recommendations  No OT follow up    Barriers to Discharge  None    Equipment Recommendations  3 in 1 bedside comode;Tub/shower bench;Other (comment) (ADL A/E kit)    Recommendations for Other Services    Frequency       Precautions / Restrictions Precautions Precautions: Posterior Hip Precaution Booklet Issued: Yes (comment) Precaution Comments: Patient able to recall all precautions and maintained with limited cues with mobility Restrictions Weight Bearing Restrictions: Yes LLE Weight Bearing: Weight bearing as tolerated   Pertinent Vitals/Pain     ADL  Grooming: Performed;Wash/dry hands;Wash/dry face;Supervision/safety;Set up Where Assessed - Grooming: Supported standing Upper Body Bathing: Supervision/safety;Set up;Simulated Lower Body Bathing: Simulated;Minimal assistance Upper Body Dressing: Performed;Supervision/safety;Set up Lower Body Dressing: Performed;Minimal assistance Toilet Transfer: Performed;Supervision/safety Toilet Transfer Method: Sit to stand;Other (comment) (ambulating from RW level) Toilet Transfer Equipment: Raised toilet seat with arms (or 3-in-1 over toilet) Toileting - Clothing Manipulation and Hygiene: Performed;Min guard Where Assessed - Engineer, mining and Hygiene: Standing Tub/Shower Transfer: Manufacturing systems engineer Method: Science writer: Counsellor Used: Long-handled shoe horn;Long-handled sponge;Reacher;Rolling walker;Gait belt;Sock aid ADL Comments: Pt and her husband provided with education and demo of ADL A/E for use at home, pt able to return demo    OT Diagnosis:    OT Problem List:   OT Treatment Interventions:     OT Goals    Visit Information  Last OT Received On: 07/18/12 Assistance Needed: +1    Subjective Data  Subjective: " I am hoping to go home today if it's not too icy out there " Patient Stated Goal: To return home   Prior Functioning     Home Living Lives With: Spouse Available Help at Discharge: Family;Available 24 hours/day Type of Home: House Home Access: Stairs to enter [employer] of Steps: 3 Entrance Stairs-Rails: None Home Layout: Two level;Bed/bath upstairs Alternate Level Stairs-Number of Steps: flight Alternate Level Stairs-Rails: Left Bathroom Shower/Tub: Engineer, manufacturing systems: Standard Bathroom Accessibility: Yes How Accessible: Accessible via walker Home Adaptive Equipment: None Prior Function Level of Independence: Independent Able to Take Stairs?: Yes Driving: Yes Vocation: Full time employment Communication Communication: No difficulties Dominant Hand: Right         Vision/Perception Vision - History Baseline Vision: Wears contacts Patient Visual Report: No change from baseline Perception Perception: Within Functional Limits   Cognition  Cognition Overall Cognitive Status: Appears within functional limits for tasks assessed/performed Arousal/Alertness: Awake/alert Orientation Level: Oriented X4 / Intact Behavior During Session: [facility] for tasks performed    Extremity/Trunk Assessment Right Upper Extremity Assessment RUE ROM/Strength/Tone: [facility] for tasks assessed Left Upper Extremity Assessment LUE ROM/Strength/Tone: WFL for tasks assessed     Mobility  Bed Mobility Bed Mobility: Sit to Supine;Scooting to HOB Supine to Sit: 6: Modified independent (Device/Increase time) Sitting - Scoot to Edge of Bed: 6: Modified  independent (Device/Increase time) Sit to Supine: 5: Supervision Scooting to [facility]: 5: Supervision Details for Bed Mobility Assistance: Verbal cues for technique to maintain hip precautions. Transfers Transfers: Sit to Stand;Stand to Sit Sit to Stand: 5: Supervision;With upper extremity assist;From chair/3-in-1;From bed Stand to Sit: 5: Supervision;To chair/3-in-1 Details for Transfer Assistance:  Cues for sequency and technique        Balance Balance Balance Assessed: No   End of Session OT - End of Session Equipment Utilized During Treatment: Gait belt;Other (comment) (RW, tub bench, 3 in 1, ADL A/E) Activity Tolerance: Patient tolerated treatment well Patient left: in bed;with call bell/phone within reach;with family/visitor present  GO     Galen Manila 07/18/2012, 2:04 PM

## 2012-07-19 ENCOUNTER — Encounter (HOSPITAL_COMMUNITY): Payer: Self-pay | Admitting: Orthopedic Surgery

## 2012-07-23 ENCOUNTER — Encounter: Payer: Self-pay | Admitting: Internal Medicine

## 2012-07-23 DIAGNOSIS — M810 Age-related osteoporosis without current pathological fracture: Secondary | ICD-10-CM | POA: Insufficient documentation

## 2012-07-23 DIAGNOSIS — R202 Paresthesia of skin: Secondary | ICD-10-CM | POA: Insufficient documentation

## 2012-07-23 DIAGNOSIS — K760 Fatty (change of) liver, not elsewhere classified: Secondary | ICD-10-CM | POA: Insufficient documentation

## 2012-10-18 ENCOUNTER — Ambulatory Visit (INDEPENDENT_AMBULATORY_CARE_PROVIDER_SITE_OTHER): Payer: BC Managed Care – PPO | Admitting: Internal Medicine

## 2012-10-18 ENCOUNTER — Encounter: Payer: Self-pay | Admitting: Internal Medicine

## 2012-10-18 VITALS — BP 108/71 | HR 85 | Temp 97.4°F | Resp 18 | Ht 66.0 in | Wt 121.0 lb

## 2012-10-18 DIAGNOSIS — E785 Hyperlipidemia, unspecified: Secondary | ICD-10-CM

## 2012-10-18 DIAGNOSIS — E559 Vitamin D deficiency, unspecified: Secondary | ICD-10-CM | POA: Insufficient documentation

## 2012-10-18 DIAGNOSIS — R209 Unspecified disturbances of skin sensation: Secondary | ICD-10-CM

## 2012-10-18 DIAGNOSIS — K219 Gastro-esophageal reflux disease without esophagitis: Secondary | ICD-10-CM

## 2012-10-18 DIAGNOSIS — M810 Age-related osteoporosis without current pathological fracture: Secondary | ICD-10-CM

## 2012-10-18 DIAGNOSIS — R7989 Other specified abnormal findings of blood chemistry: Secondary | ICD-10-CM

## 2012-10-18 DIAGNOSIS — Z139 Encounter for screening, unspecified: Secondary | ICD-10-CM

## 2012-10-18 DIAGNOSIS — R202 Paresthesia of skin: Secondary | ICD-10-CM

## 2012-10-18 DIAGNOSIS — R930 Abnormal findings on diagnostic imaging of skull and head, not elsewhere classified: Secondary | ICD-10-CM

## 2012-10-18 DIAGNOSIS — E039 Hypothyroidism, unspecified: Secondary | ICD-10-CM

## 2012-10-18 LAB — CBC WITH DIFFERENTIAL/PLATELET
Basophils Absolute: 0 10*3/uL (ref 0.0–0.1)
Basophils Relative: 0 % (ref 0–1)
Eosinophils Absolute: 0.1 10*3/uL (ref 0.0–0.7)
Eosinophils Relative: 1 % (ref 0–5)
HCT: 40.5 % (ref 36.0–46.0)
Hemoglobin: 13.5 g/dL (ref 12.0–15.0)
Lymphocytes Relative: 36 % (ref 12–46)
Lymphs Abs: 2.8 10*3/uL (ref 0.7–4.0)
MCH: 31.3 pg (ref 26.0–34.0)
MCHC: 33.3 g/dL (ref 30.0–36.0)
MCV: 94 fL (ref 78.0–100.0)
Monocytes Absolute: 0.7 10*3/uL (ref 0.1–1.0)
Monocytes Relative: 9 % (ref 3–12)
Neutro Abs: 4.2 10*3/uL (ref 1.7–7.7)
Neutrophils Relative %: 54 % (ref 43–77)
Platelets: 250 10*3/uL (ref 150–400)
RBC: 4.31 MIL/uL (ref 3.87–5.11)
RDW: 13.1 % (ref 11.5–15.5)
WBC: 7.7 10*3/uL (ref 4.0–10.5)

## 2012-10-18 LAB — POCT URINALYSIS DIPSTICK
Bilirubin, UA: NEGATIVE
Blood, UA: NEGATIVE
Glucose, UA: NEGATIVE
Ketones, UA: NEGATIVE
Leukocytes, UA: NEGATIVE
Nitrite, UA: NEGATIVE
Protein, UA: NEGATIVE
Spec Grav, UA: 1.02
Urobilinogen, UA: NEGATIVE
pH, UA: 5.5

## 2012-10-18 LAB — COMPREHENSIVE METABOLIC PANEL
ALT: 16 U/L (ref 0–35)
AST: 22 U/L (ref 0–37)
Albumin: 4.2 g/dL (ref 3.5–5.2)
Alkaline Phosphatase: 65 U/L (ref 39–117)
BUN: 13 mg/dL (ref 6–23)
CO2: 28 mEq/L (ref 19–32)
Calcium: 9.6 mg/dL (ref 8.4–10.5)
Chloride: 103 mEq/L (ref 96–112)
Creat: 0.72 mg/dL (ref 0.50–1.10)
Glucose, Bld: 89 mg/dL (ref 70–99)
Potassium: 3.9 mEq/L (ref 3.5–5.3)
Sodium: 139 mEq/L (ref 135–145)
Total Bilirubin: 0.4 mg/dL (ref 0.3–1.2)
Total Protein: 6.8 g/dL (ref 6.0–8.3)

## 2012-10-18 LAB — LIPID PANEL
Cholesterol: 151 mg/dL (ref 0–200)
HDL: 68 mg/dL (ref >39)
LDL Cholesterol: 66 mg/dL (ref 0–99)
Total CHOL/HDL Ratio: 2.2 Ratio
Triglycerides: 87 mg/dL (ref <150)
VLDL: 17 mg/dL (ref 0–40)

## 2012-10-18 NOTE — Patient Instructions (Addendum)
Activate my chart 

## 2012-10-18 NOTE — Progress Notes (Signed)
Subjective:    Patient ID: Dorothy [name], female    DOB: 1950/09/01, 62 y.o.   MRN: [id number]  HPI Carlei is here for CPE  She is post op Left THR for avascular necrosis   And is recovering as expected.  She did have post op anemia with hgb 8.6 on 07/2012.  She tells me Dr. Turner Daniels has not checked any blood work in his office as yet.  She is off pain medicine and is allowed to resume playing tennis next week.  Osteoporosis  She is not a candidate for bisphosphanates as she has had multiple dental bone implants in the past .  She is on Evista and Calcium with Vitamin D  .  Tolerating EVista well. Due for her dexa in 2015  She sees Dr. Alton Revere for abnormal MRI that began with paresthesias that involved her entire R side of body.  She has had no further symptoms now..   At some point she would like a neurologist in [place]  She has a history of transaminitis with a fatty liver.  Allergies not much of a problem now  Last pap 08/2010  With neg HPV.  See scanned result      Allergies  Allergen Reactions  . Fosamax (Alendronate Sodium)     Poor dentition  , bone implant  . Latex Rash   Past Medical History  Diagnosis Date  . Thyroid disease   . Hyperlipidemia   . PONV (postoperative nausea and vomiting)   . Hypothyroidism   . Arthritis   . Osteopenia   . 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 Bruises easily     more on hands and arms   Past Surgical History  Procedure Laterality Date  . Ganglion cyst excision Right   . Dental surgery  1993  . Total hip arthroplasty Left 07/17/2012    Dr Turner Daniels  . Total hip arthroplasty Left 07/17/2012    Procedure: TOTAL HIP ARTHROPLASTY;  Surgeon: Nestor Lewandowsky, MD;  Location: [facility] OR;  Service: Orthopedics;  Laterality: Left;   History   Social History  . Marital Status: Married    Spouse Name: N/A    Number of Children: N/A  . Years of Education: N/A   Occupational History  . Not on file.   Social History  Main Topics  . Smoking status: Former Smoker -- 20 years    Types: Cigarettes    Quit date: 11/11/2011  . Smokeless tobacco: Never Used  . Alcohol Use: 0.6 oz/week    1 Glasses of wine per week     Comment: occassional  . Drug Use: No  . Sexually Active: Yes   Other Topics Concern  . Not on file   Social History Narrative  . No narrative on file   Family History  Problem Relation Age of Onset  . Parkinson's disease Father   . Arthritis Maternal Grandmother    Patient Active Problem List   Diagnosis Date Noted  . Vitamin D deficiency 10/18/2012  . Personal history of colonic polyps 07/23/2012  . Paresthesias 07/23/2012  . Fatty liver disease, nonalcoholic 07/23/2012  . Osteoporosis, unspecified 07/23/2012  . Avascular necrosis of hip 07/18/2012  . GERD (gastroesophageal reflux disease) 07/12/2012  . Avascular necrosis of bone of hip 07/12/2012  . Other and unspecified hyperlipidemia 07/12/2012  . Hypothyroidism 07/12/2012  . Abnormal MRI of head 07/12/2012   Current Outpatient Prescriptions on File Prior to Visit  Medication Sig Dispense Refill  . aspirin EC 81 MG tablet Take 81 mg by mouth daily.      . Calcium Carbonate-Vitamin D (CALCIUM 600+D) 600-400 MG-UNIT per tablet Take 2 tablets by mouth daily.      . Cholecalciferol (VITAMIN D) 2000 UNITS CAPS Take 1 capsule by mouth daily.      Marland Kitchen docusate sodium (COLACE) 100 MG capsule Take 100 mg by mouth every 6 (six) hours as needed for constipation.      Marland Kitchen levothyroxine (SYNTHROID, LEVOTHROID) 100 MCG tablet Take 100 mcg by mouth daily.      Marland Kitchen PRESCRIPTION MEDICATION Apply 1 application topically 2 (two) times daily. Compounded medication, ketm 10%, baclofen 2%, bupivacaine 1%, cyclobenzaprine 2%, gabapentin 6%, orphen 5%, pent 3%.  Can use 1-2 grams.      . raloxifene (EVISTA) 60 MG tablet Take 60 mg by mouth daily.      Marland Kitchen saccharomyces boulardii (FLORASTOR) 250 MG capsule Take 250 mg by mouth daily.      . simvastatin  (ZOCOR) 20 MG tablet Take 20 mg by mouth every evening.      . methocarbamol (ROBAXIN) 500 MG tablet Take 1 tablet (500 mg total) by mouth every 6 (six) hours as needed.  60 tablet  0  . oxyCODONE (OXY IR/ROXICODONE) 5 MG immediate release tablet Take 1-2 tablets (5-10 mg total) by mouth every 3 (three) hours as needed.  60 tablet  0  . valACYclovir (VALTREX) 500 MG tablet Take 500 mg by mouth daily as needed. Fever blister/cold sore.       No current facility-administered medications on file prior to visit.    a   Review of Systems  All other systems reviewed and are negative.       Objective:   Physical Exam Physical Exam  Nursing note and vitals reviewed.  Constitutional: She is oriented to person, place, and time. She appears well-developed and well-nourished.  HENT:  Head: Normocephalic and atraumatic.  Right Ear: Tympanic membrane and ear canal normal. No drainage. Tympanic membrane is not injected and not erythematous.  Left Ear: Tympanic membrane and ear canal normal. No drainage. Tympanic membrane is not injected and not erythematous.  Nose: Nose normal. Right sinus exhibits no maxillary sinus tenderness and no frontal sinus tenderness. Left sinus exhibits no maxillary sinus tenderness and no frontal sinus tenderness.  Mouth/Throat: Oropharynx is clear and moist. No oral lesions. No oropharyngeal exudate.  Eyes: Conjunctivae and EOM are normal. Pupils are equal, round, and reactive to light.  Neck: Normal range of motion. Neck supple. No JVD present. Carotid bruit is not present. No mass and no thyromegaly present.  Cardiovascular: Normal rate, regular rhythm, S1 normal, S2 normal and intact distal pulses. Exam reveals no gallop and no friction rub.  No murmur heard.  Pulses:  Carotid pulses are 2+ on the right side, and 2+ on the left side.  Dorsalis pedis pulses are 2+ on the right side, and 2+ on the left side.  No carotid bruit. No LE edema  Pulmonary/Chest: Breath  sounds normal. She has no wheezes. She has no rales. She exhibits no tenderness. Breast no discrete masses no nipple discharge no axillary adenpathy bilaterally Abdominal: Soft. Bowel sounds are normal. She exhibits no distension and no mass. There is no hepatosplenomegaly. There is no tenderness. There is no CVA tenderness.  Rectal :  No mass guaiac neg.   Musculoskeletal: Normal range of motion.  No active synovitis to joints.  Lymphadenopathy:  She has no cervical adenopathy.  She has no axillary adenopathy.  Right: No inguinal and no supraclavicular adenopathy present.  Left: No inguinal and no supraclavicular adenopathy present.  Neurological: She is alert and oriented to person, place, and time. She has normal strength and normal reflexes. She displays no tremor. No cranial nerve deficit or sensory deficit. Coordination and gait normal.  Skin: Skin is warm and dry. No rash noted. No cyanosis. Nails show no clubbing. Multiple nevi Psychiatric: She has a normal mood and affect. Her speech is normal and behavior is normal. Cognition and memory are normal.           Assessment & Plan:  Health Maintenance:  She is counseled to call [facility] to find out when her last tetanus was as it is not recorded in her old records.  UTD with colonoscopy  MM neg 06/2012.    See scanned HM sheet.  Osteoprosis  Continue Evista and ca++/Vit D  History of paresthesias:   Will refer to Dr. Vickey Huger.  History of transaminitis  Will recheck LFT"S today  Hypothryoidism  Check TSH today  Hyperlipidemia    GERD  Continue meds  Allergic rhinitis  Continue Flonase as needed  Colonic polyps  Due for colonoscopy 2023 Dr. Loreta Ave

## 2012-10-19 LAB — VITAMIN D 25 HYDROXY (VIT D DEFICIENCY, FRACTURES): Vit D, 25-Hydroxy: 54 ng/mL (ref 30–89)

## 2012-10-19 LAB — TSH: TSH: 0.83 u[IU]/mL (ref 0.350–4.500)

## 2012-10-23 ENCOUNTER — Telehealth: Payer: Self-pay | Admitting: *Deleted

## 2012-10-23 ENCOUNTER — Encounter: Payer: Self-pay | Admitting: *Deleted

## 2012-10-23 MED ORDER — LEVOTHYROXINE SODIUM 100 MCG PO TABS: 100.0000 ug | ORAL_TABLET | Freq: Every day | ORAL | Status: DC

## 2012-10-23 MED ORDER — SIMVASTATIN 20 MG PO TABS: 20.0000 mg | ORAL_TABLET | Freq: Every evening | ORAL | Status: DC

## 2012-10-23 MED ORDER — RALOXIFENE HCL 60 MG PO TABS: 60.0000 mg | ORAL_TABLET | Freq: Every day | ORAL | Status: DC

## 2012-10-23 NOTE — Telephone Encounter (Signed)
Refill request for Levothyroxine, eVista, and Simvastatin.  Please fax to [facility] [phone].  Patient is running low on meds.

## 2012-10-23 NOTE — Telephone Encounter (Signed)
Karen Kitchens  Send this as refill request

## 2012-10-23 NOTE — Telephone Encounter (Signed)
Refill request

## 2012-10-23 NOTE — Telephone Encounter (Signed)
Send as RX request

## 2012-10-23 NOTE — Telephone Encounter (Signed)
refill 

## 2013-02-27 ENCOUNTER — Telehealth: Payer: Self-pay | Admitting: *Deleted

## 2013-02-27 NOTE — Telephone Encounter (Signed)
Daje called and left a message that she needs [place] to call her back about a Rx refill.  I have attempted to call her today 02/27/13 at 8:15 am, 10:30 am, and 12:25 pm and the number she requested [place] to call back ([phone]) has been busy each time. I also left a message on her home #.

## 2013-03-12 ENCOUNTER — Telehealth: Payer: Self-pay | Admitting: *Deleted

## 2013-03-12 ENCOUNTER — Other Ambulatory Visit: Payer: Self-pay | Admitting: *Deleted

## 2013-03-12 MED ORDER — VALACYCLOVIR HCL 500 MG PO TABS: ORAL_TABLET | ORAL | Status: DC

## 2013-03-12 NOTE — Telephone Encounter (Signed)
Needs her valACYclovir (VALTREX) 500 MG tablet refilled at [facility]

## 2013-03-12 NOTE — Telephone Encounter (Signed)
Refill request

## 2013-05-15 ENCOUNTER — Other Ambulatory Visit: Payer: Self-pay | Admitting: *Deleted

## 2013-05-15 ENCOUNTER — Telehealth: Payer: Self-pay | Admitting: *Deleted

## 2013-05-15 MED ORDER — LEVOTHYROXINE SODIUM 100 MCG PO TABS: 100.0000 ug | ORAL_TABLET | Freq: Every day | ORAL | Status: DC

## 2013-05-15 NOTE — Telephone Encounter (Signed)
Refill request

## 2013-05-21 ENCOUNTER — Other Ambulatory Visit: Payer: Self-pay | Admitting: *Deleted

## 2013-05-21 MED ORDER — SIMVASTATIN 20 MG PO TABS: 20.0000 mg | ORAL_TABLET | Freq: Every evening | ORAL | Status: DC

## 2013-05-21 MED ORDER — RALOXIFENE HCL 60 MG PO TABS: 60.0000 mg | ORAL_TABLET | Freq: Every day | ORAL | Status: DC

## 2013-05-21 NOTE — Telephone Encounter (Signed)
Refill request

## 2013-07-24 ENCOUNTER — Other Ambulatory Visit: Payer: Self-pay

## 2013-07-24 DIAGNOSIS — Z1231 Encounter for screening mammogram for malignant neoplasm of breast: Secondary | ICD-10-CM

## 2013-07-31 ENCOUNTER — Encounter: Payer: Self-pay | Admitting: Internal Medicine

## 2013-07-31 ENCOUNTER — Ambulatory Visit (INDEPENDENT_AMBULATORY_CARE_PROVIDER_SITE_OTHER): Payer: BC Managed Care – PPO | Admitting: Internal Medicine

## 2013-07-31 VITALS — BP 131/85 | HR 105 | Temp 98.3°F | Resp 18 | Wt 126.0 lb

## 2013-07-31 DIAGNOSIS — K219 Gastro-esophageal reflux disease without esophagitis: Secondary | ICD-10-CM

## 2013-07-31 DIAGNOSIS — R0789 Other chest pain: Secondary | ICD-10-CM

## 2013-07-31 MED ORDER — VALACYCLOVIR HCL 500 MG PO TABS: ORAL_TABLET | ORAL | Status: DC

## 2013-07-31 MED ORDER — PANTOPRAZOLE SODIUM 40 MG PO TBEC: 40.0000 mg | DELAYED_RELEASE_TABLET | Freq: Every day | ORAL | Status: DC

## 2013-07-31 NOTE — Progress Notes (Signed)
Subjective:    Patient ID: Dorothy [name], female    DOB: 04-Sep-1950, 63 y.o.   MRN: [id number]  HPI Dorothy [name] is here for acute visit.    Reports burning acid sensation in throat last several days.  She had been on Aciphex for about 10 years and stopped in 2013 due to concerns about bone health.   No change in color of stool   At times has burning sensation in middle of chest  No radiation to the left.  No sob, diaphoresis or N/V  Lasts several hours  She eats dinner late at night  aound 8:30  Allergies  Allergen Reactions  . Fosamax [Alendronate Sodium]     Poor dentition  , bone implant  . Latex Rash  . Penicillins Rash   Past Medical History  Diagnosis Date  . Thyroid disease   . Hyperlipidemia   . PONV (postoperative nausea and vomiting)   . Hypothyroidism   . Arthritis   . Osteopenia   . 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 Bruises easily     more on hands and arms   Past Surgical History  Procedure Laterality Date  . Ganglion cyst excision Right   . Dental surgery  1993  . 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 Total hip arthroplasty Left 07/17/2012    Procedure: TOTAL HIP ARTHROPLASTY;  Surgeon: Kerin Salen, MD;  Location: [place];  Service: Orthopedics;  Laterality: Left;   History   Social History  . Marital Status: Married    Spouse Name: N/A    Number of Children: N/A  . Years of Education: N/A   Occupational History  . Not on file.   Social History Main Topics  . Smoking status: Former Smoker -- 20 years    Types: Cigarettes    Quit date: 11/11/2011  . Smokeless tobacco: Never Used  . Alcohol Use: 0.6 oz/week    1 Glasses of wine per week     Comment: occassional  . Drug Use: No  . Sexual Activity: Yes   Other Topics Concern  . Not on file   Social History Narrative  . No narrative on file   Family History  Problem Relation Age of Onset  . Parkinson's disease Father   . Arthritis  Maternal Grandmother    Patient Active Problem List   Diagnosis Date Noted  . Vitamin D deficiency 10/18/2012  . Elevated liver function tests 10/18/2012  . Personal history of colonic polyps 07/23/2012  . Paresthesias 07/23/2012  . Fatty liver disease, nonalcoholic 70/62/3762  . Osteoporosis, unspecified 07/23/2012  . Avascular necrosis of hip 07/18/2012  . GERD (gastroesophageal reflux disease) 07/12/2012  . Avascular necrosis of bone of hip 07/12/2012  . Other and unspecified hyperlipidemia 07/12/2012  . Hypothyroidism 07/12/2012  . Abnormal MRI of head 07/12/2012   Current Outpatient Prescriptions on File Prior to Visit  Medication Sig Dispense Refill  . aspirin EC 81 MG tablet Take 81 mg by mouth daily.      . Calcium Carbonate-Vitamin D (CALCIUM 600+D) 600-400 MG-UNIT per tablet Take 2 tablets by mouth daily.      . Cholecalciferol (VITAMIN D) 2000 UNITS CAPS Take 1 capsule by mouth daily.      Marland Kitchen docusate sodium (COLACE) 100 MG capsule Take 100 mg by mouth every 6 (six) hours as needed for constipation.      Marland Kitchen levothyroxine (SYNTHROID,  LEVOTHROID) 100 MCG tablet Take 1 tablet (100 mcg total) by mouth daily.  90 tablet  3  . raloxifene (EVISTA) 60 MG tablet Take 1 tablet (60 mg total) by mouth daily.  90 tablet  1  . saccharomyces boulardii (FLORASTOR) 250 MG capsule Take 250 mg by mouth daily.      . simvastatin (ZOCOR) 20 MG tablet Take 1 tablet (20 mg total) by mouth every evening.  90 tablet  1  . valACYclovir (VALTREX) 500 MG tablet Take one tablet bid for 3 days for cold sore  6 tablet  1   No current facility-administered medications on file prior to visit.       Review of Systems    see HPI Objective:   Physical Exam  Physical Exam  Nursing note and vitals reviewed.  Constitutional: She is oriented to person, place, and time. She appears well-developed and well-nourished.  HENT:  Head: Normocephalic and atraumatic.  Cardiovascular: Normal rate and regular  rhythm. Exam reveals no gallop and no friction rub.  No murmur heard.  Pulmonary/Chest: Breath sounds normal. She has no wheezes. She has no rales.  Neurological: She is alert and oriented to person, place, and time.  Skin: Skin is warm and dry.  Psychiatric: She has a normal mood and affect. Her behavior is normal.            Assessment & Plan:  GERD  Protonix RS 40 mg daily  Check H pylori  Epigstric burning  EKG normal    Symptoms likely GI   Labs today

## 2013-08-02 ENCOUNTER — Ambulatory Visit
Admission: RE | Admit: 2013-08-02 | Discharge: 2013-08-02 | Disposition: A | Discharge status: Home / Self Care | Payer: BC Managed Care – PPO | Source: Ambulatory Visit

## 2013-08-02 DIAGNOSIS — Z1231 Encounter for screening mammogram for malignant neoplasm of breast: Secondary | ICD-10-CM

## 2013-10-24 ENCOUNTER — Encounter: Payer: Self-pay | Admitting: Internal Medicine

## 2013-10-24 ENCOUNTER — Ambulatory Visit (INDEPENDENT_AMBULATORY_CARE_PROVIDER_SITE_OTHER): Payer: BC Managed Care – PPO | Admitting: Internal Medicine

## 2013-10-24 VITALS — BP 92/60 | HR 76 | Temp 97.7°F | Resp 18 | Wt 126.0 lb

## 2013-10-24 DIAGNOSIS — Z1211 Encounter for screening for malignant neoplasm of colon: Secondary | ICD-10-CM

## 2013-10-24 DIAGNOSIS — D126 Benign neoplasm of colon, unspecified: Secondary | ICD-10-CM

## 2013-10-24 DIAGNOSIS — K635 Polyp of colon: Secondary | ICD-10-CM

## 2013-10-24 DIAGNOSIS — Z0000 Encounter for general adult medical examination without abnormal findings: Secondary | ICD-10-CM

## 2013-10-24 DIAGNOSIS — E785 Hyperlipidemia, unspecified: Secondary | ICD-10-CM

## 2013-10-24 DIAGNOSIS — E559 Vitamin D deficiency, unspecified: Secondary | ICD-10-CM

## 2013-10-24 DIAGNOSIS — Z1151 Encounter for screening for human papillomavirus (HPV): Secondary | ICD-10-CM

## 2013-10-24 DIAGNOSIS — E039 Hypothyroidism, unspecified: Secondary | ICD-10-CM

## 2013-10-24 DIAGNOSIS — Z124 Encounter for screening for malignant neoplasm of cervix: Secondary | ICD-10-CM

## 2013-10-24 LAB — POCT URINALYSIS DIPSTICK
Bilirubin, UA: NEGATIVE
Blood, UA: NEGATIVE
Glucose, UA: NEGATIVE
Ketones, UA: NEGATIVE
Leukocytes, UA: NEGATIVE
Nitrite, UA: NEGATIVE
Protein, UA: NEGATIVE
Spec Grav, UA: 1.015
Urobilinogen, UA: NEGATIVE
pH, UA: 5.5

## 2013-10-24 LAB — HM PAP SMEAR: HM Pap smear: NORMAL

## 2013-10-24 LAB — HEMOCCULT GUIAC POC 1CARD (OFFICE): Fecal Occult Blood, POC: NEGATIVE

## 2013-10-24 NOTE — Progress Notes (Signed)
Subjective:    Patient ID: Dorothy [name], female    DOB: 19-Feb-1951, 63 y.o.   MRN: [id number]  HPI Dorothy [name] is here for CPE  I have not seen her in a while.   She is S/P  Left THR Dr. Mayer Camel    Osteoporosis  She declines  bisphosphanates due to SE profile.  She is on EVista  She is due for DEXA but is going out of town for 2 months and would like to get this upon her return  T-score left femur  -3.0   Abnormal brain MRI  In 2011 pt reports an episode of Right sided numbness.  She has seen two different neurologists about this   Dr. Doy Hutching and Dr. Rayvon Char.  Has not had any further numbness in the last 2 years.  She never had any visual, speech or muscle weakness during this period.    She did have neuroimaging and [facility]  06/2010 :   MRI brain  Showed non-specific perventricular gliosis and MRI of thoracic spine was normal.      She reports she has not followed with a neurologist since then.  As stated above no symptoms in the last two years.   Tobacco use rare cigarette now per her report .  Has smoked 30 years   Allergies  Allergen Reactions  . Fosamax [Alendronate Sodium]     Poor dentition  , bone implant  . Latex Rash  . Penicillins Rash   Past Medical History  Diagnosis Date  . Thyroid disease   . Hyperlipidemia   . PONV (postoperative nausea and vomiting)   . Hypothyroidism   . Arthritis   . Osteopenia   . 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 Bruises easily     more on hands and arms   Past Surgical History  Procedure Laterality Date  . Ganglion cyst excision Right   . Dental surgery  1993  . 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 Total hip arthroplasty Left 07/17/2012    Procedure: TOTAL HIP ARTHROPLASTY;  Surgeon: Kerin Salen, MD;  Location: [place];  Service: Orthopedics;  Laterality: Left;   History   Social History  . Marital Status: Married    Spouse Name: N/A    Number of Children: N/A  .  Years of Education: N/A   Occupational History  . Not on file.   Social History Main Topics  . Smoking status: Former Smoker -- 20 years    Types: Cigarettes    Quit date: 11/11/2011  . Smokeless tobacco: Never Used  . Alcohol Use: 0.6 oz/week    1 Glasses of wine per week     Comment: occassional  . Drug Use: No  . Sexual Activity: Yes   Other Topics Concern  . Not on file   Social History Narrative  . No narrative on file   Family History  Problem Relation Age of Onset  . Parkinson's disease Father   . Arthritis Maternal Grandmother    Patient Active Problem List   Diagnosis Date Noted  . Colon polyp 10/24/2013  . Vitamin D deficiency 10/18/2012  . Elevated liver function tests 10/18/2012  . Paresthesias 07/23/2012  . Fatty liver disease, nonalcoholic 99/24/2683  . Osteoporosis, unspecified 07/23/2012  . Avascular necrosis of hip 07/18/2012  . GERD (gastroesophageal reflux disease) 07/12/2012  . Avascular necrosis of bone of hip 07/12/2012  .  Other and unspecified hyperlipidemia 07/12/2012  . Hypothyroidism 07/12/2012  . Abnormal MRI of head 07/12/2012   Current Outpatient Prescriptions on File Prior to Visit  Medication Sig Dispense Refill  . aspirin EC 81 MG tablet Take 81 mg by mouth daily.      . Calcium Carbonate-Vitamin D (CALCIUM 600+D) 600-400 MG-UNIT per tablet Take 2 tablets by mouth daily.      Marland Kitchen docusate sodium (COLACE) 100 MG capsule Take 100 mg by mouth every 6 (six) hours as needed for constipation.      Marland Kitchen levothyroxine (SYNTHROID, LEVOTHROID) 100 MCG tablet Take 1 tablet (100 mcg total) by mouth daily.  90 tablet  3  . raloxifene (EVISTA) 60 MG tablet Take 1 tablet (60 mg total) by mouth daily.  90 tablet  1  . simvastatin (ZOCOR) 20 MG tablet Take 1 tablet (20 mg total) by mouth every evening.  90 tablet  1  . valACYclovir (VALTREX) 500 MG tablet Take one tablet bid for 3 days for cold sore  90 tablet  0  . Cholecalciferol (VITAMIN D) 2000 UNITS  CAPS Take 1 capsule by mouth daily.      . pantoprazole (PROTONIX) 40 MG tablet Take 1 tablet (40 mg total) by mouth daily.  60 tablet  1  . saccharomyces boulardii (FLORASTOR) 250 MG capsule Take 250 mg by mouth daily.       No current facility-administered medications on file prior to visit.       Review of Systems  Respiratory: Negative for cough, chest tightness, shortness of breath and wheezing.   Cardiovascular: Negative for chest pain, palpitations and leg swelling.  Gastrointestinal: Negative for abdominal pain.  Neurological: Negative for dizziness, tremors, facial asymmetry, speech difficulty, weakness, light-headedness, numbness and headaches.  All other systems reviewed and are negative.      Objective:   Physical Exam Physical Exam  Vital signs and nursing note reviewed  Constitutional: She is oriented to person, place, and time. She appears well-developed and well-nourished. She is cooperative.  HENT:  Head: Normocephalic and atraumatic.  Right Ear: Tympanic membrane normal.  Left Ear: Tympanic membrane normal.  Nose: Nose normal.  Mouth/Throat: Oropharynx is clear and moist and mucous membranes are normal. No oropharyngeal exudate or posterior oropharyngeal erythema.  Eyes: Conjunctivae and EOM are normal. Pupils are equal, round, and reactive to light.  Neck: Neck supple. No JVD present. Carotid bruit is not present. No mass and no thyromegaly present.  Cardiovascular: Regular rhythm, normal heart sounds, intact distal pulses and normal pulses.  Exam reveals no gallop and no friction rub.   No murmur heard. Pulses:      Dorsalis pedis pulses are 2+ on the right side, and 2+ on the left side.  Pulmonary/Chest: Breath sounds normal. She has no wheezes. She has no rhonchi. She has no rales. Right breast exhibits no mass, no nipple discharge and no skin change. Left breast exhibits no mass, no nipple discharge and no skin change.  Abdominal: Soft. Bowel sounds are  normal. She exhibits no distension and no mass. There is no hepatosplenomegaly. There is no tenderness. There is no CVA tenderness.   Rectal no mass guaiac neg Genitourinary: Rectum normal, vagina normal and uterus normal. Rectal exam shows no mass. Guaiac negative stool. No labial fusion. There is no lesion on the right labia. There is no lesion on the left labia. Cervix exhibits no motion tenderness. Right adnexum displays no mass, no tenderness and no fullness. Left adnexum displays  no mass, no tenderness and no fullness. No erythema around the vagina.  Musculoskeletal:       No active synovitis to any joint.    Lymphadenopathy:       Right cervical: No superficial cervical adenopathy present.      Left cervical: No superficial cervical adenopathy present.       Right axillary: No pectoral and no lateral adenopathy present.       Left axillary: No pectoral and no lateral adenopathy present.      Right: No inguinal adenopathy present.       Left: No inguinal adenopathy present.  Neurological: She is alert and oriented to person, place, and time. She has normal strength and normal reflexes. No cranial nerve deficit or sensory deficit. She displays a negative Romberg sign. Coordination and gait normal.  Skin: Skin is warm and dry. No abrasion, no bruising, no ecchymosis and no rash noted. No cyanosis. Nails show no clubbing.  Psychiatric: She has a normal mood and affect. Her speech is normal and behavior is normal.          Assessment & Plan:   HM:    UTD with mm and vaccines  Pap today.  Counseled regarding lung cancer screening guideline and I advised screeing CT.  She declines now but would like ot have when she returns to [place]  Osteoporosis  She is on Evista  I counseled she needs DEXA but she is leaving for 2 months and requests to have this done in the fall.  I advised pt to schedule to see me then  Abnormal MRI:  Pt requests to have neurologist. Referral   Will set up for her when  she is back in town  Colon polyp  Managed by Dr. Collene Mares  Hypothroidism  Will check today   Tobacco use   Avascular necrosis let hip  S/P  THR   Vitamin D deficiency she is on daily supplement  Will check today    Pt has appt scheduled for 9/9       Assessment & Plan:

## 2013-10-24 NOTE — Patient Instructions (Signed)
See me in September  30 mins

## 2013-10-25 ENCOUNTER — Encounter: Payer: Self-pay | Admitting: *Deleted

## 2013-10-25 LAB — CBC WITH DIFFERENTIAL/PLATELET
Basophils Absolute: 0 10*3/uL (ref 0.0–0.1)
Basophils Relative: 0 % (ref 0–1)
Eosinophils Absolute: 0.1 10*3/uL (ref 0.0–0.7)
Eosinophils Relative: 1 % (ref 0–5)
HCT: 40.2 % (ref 36.0–46.0)
Hemoglobin: 13.7 g/dL (ref 12.0–15.0)
Lymphocytes Relative: 29 % (ref 12–46)
Lymphs Abs: 1.9 10*3/uL (ref 0.7–4.0)
MCH: 32.7 pg (ref 26.0–34.0)
MCHC: 34.1 g/dL (ref 30.0–36.0)
MCV: 95.9 fL (ref 78.0–100.0)
Monocytes Absolute: 0.5 10*3/uL (ref 0.1–1.0)
Monocytes Relative: 8 % (ref 3–12)
Neutro Abs: 4.1 10*3/uL (ref 1.7–7.7)
Neutrophils Relative %: 62 % (ref 43–77)
Platelets: 251 10*3/uL (ref 150–400)
RBC: 4.19 MIL/uL (ref 3.87–5.11)
RDW: 13 % (ref 11.5–15.5)
WBC: 6.6 10*3/uL (ref 4.0–10.5)

## 2013-10-25 LAB — COMPREHENSIVE METABOLIC PANEL
ALT: 20 U/L (ref 0–35)
AST: 25 U/L (ref 0–37)
Albumin: 4.2 g/dL (ref 3.5–5.2)
Alkaline Phosphatase: 61 U/L (ref 39–117)
BUN: 10 mg/dL (ref 6–23)
CO2: 22 mEq/L (ref 19–32)
Calcium: 9.1 mg/dL (ref 8.4–10.5)
Chloride: 107 mEq/L (ref 96–112)
Creat: 0.66 mg/dL (ref 0.50–1.10)
Glucose, Bld: 89 mg/dL (ref 70–99)
Potassium: 4 mEq/L (ref 3.5–5.3)
Sodium: 139 mEq/L (ref 135–145)
Total Bilirubin: 0.5 mg/dL (ref 0.2–1.2)
Total Protein: 6.3 g/dL (ref 6.0–8.3)

## 2013-10-25 LAB — TSH: TSH: 1.738 u[IU]/mL (ref 0.350–4.500)

## 2013-10-25 LAB — LIPID PANEL
Cholesterol: 165 mg/dL (ref 0–200)
HDL: 78 mg/dL (ref >39)
LDL Cholesterol: 70 mg/dL (ref 0–99)
Total CHOL/HDL Ratio: 2.1 Ratio
Triglycerides: 84 mg/dL (ref <150)
VLDL: 17 mg/dL (ref 0–40)

## 2013-10-25 LAB — VITAMIN D 25 HYDROXY (VIT D DEFICIENCY, FRACTURES): Vit D, 25-Hydroxy: 46 ng/mL (ref 30–89)

## 2013-10-26 LAB — H. PYLORI ANTIBODY, IGA: HELICOBACTER PYLORI AB, IGA: 5.3 U/mL (ref <9.0)

## 2013-10-30 ENCOUNTER — Encounter: Payer: Self-pay | Admitting: *Deleted

## 2013-10-30 LAB — CYTOLOGY - PAP

## 2013-11-07 ENCOUNTER — Telehealth: Payer: Self-pay | Admitting: *Deleted

## 2013-11-07 DIAGNOSIS — Z760 Encounter for issue of repeat prescription: Secondary | ICD-10-CM

## 2013-11-07 MED ORDER — LEVOTHYROXINE SODIUM 100 MCG PO TABS: 100.0000 ug | ORAL_TABLET | Freq: Every day | ORAL | Status: DC

## 2013-11-07 MED ORDER — VALACYCLOVIR HCL 500 MG PO TABS: ORAL_TABLET | ORAL | Status: DC

## 2013-11-07 MED ORDER — PANTOPRAZOLE SODIUM 40 MG PO TBEC: 40.0000 mg | DELAYED_RELEASE_TABLET | Freq: Every day | ORAL | Status: DC

## 2013-11-07 MED ORDER — SIMVASTATIN 20 MG PO TABS: 20.0000 mg | ORAL_TABLET | Freq: Every evening | ORAL | Status: DC

## 2013-11-07 MED ORDER — RALOXIFENE HCL 60 MG PO TABS: 60.0000 mg | ORAL_TABLET | Freq: Every day | ORAL | Status: DC

## 2013-11-07 NOTE — Telephone Encounter (Signed)
Dorothy [name] called today and left message that she wanted her lab results.  I tried calling her, but I got no answer.  We mailed her labs to her on 10/30/13, and everything was normal.  She mentioned in her message that she would like to know what new medications Dr Coralyn Mark will be calling in for her to [place].  She is out of town for a couple of months and wanted to know what to be expecting to be sent.  Please call her.

## 2013-11-07 NOTE — Telephone Encounter (Signed)
Dorothy [name] returned my call.  She is in need of some prescription refills Raloxifene, Levothyroxine, Simvastatin, Protonix, and her Valtrex.  She just got it filled in March for 90 tabs. She said she still has plenty just uses as needed, but she thought it might be easier to refill all meds at once since [facility] is hard to get Rx from.  Send to [facility] location

## 2013-11-07 NOTE — Addendum Note (Signed)
Addended by: Gretchen Short on: 11/07/2013 04:43 PM   Modules accepted: Orders

## 2013-11-07 NOTE — Telephone Encounter (Signed)
Sent refill to [facility]

## 2013-11-19 NOTE — Telephone Encounter (Signed)
error 

## 2013-12-10 DIAGNOSIS — S62102A Fracture of unspecified carpal bone, left wrist, initial encounter for closed fracture: Secondary | ICD-10-CM

## 2013-12-10 HISTORY — DX: Fracture of unspecified carpal bone, left wrist, initial encounter for closed fracture: S62.102A

## 2014-01-23 ENCOUNTER — Ambulatory Visit: Payer: BC Managed Care – PPO | Admitting: Internal Medicine

## 2014-01-31 ENCOUNTER — Ambulatory Visit: Payer: BC Managed Care – PPO | Admitting: Internal Medicine

## 2014-02-20 ENCOUNTER — Encounter: Payer: Self-pay | Admitting: Internal Medicine

## 2014-02-20 ENCOUNTER — Ambulatory Visit (INDEPENDENT_AMBULATORY_CARE_PROVIDER_SITE_OTHER): Payer: BC Managed Care – PPO | Admitting: Internal Medicine

## 2014-02-20 VITALS — BP 119/72 | HR 100 | Temp 98.2°F | Resp 16 | Ht 66.0 in | Wt 129.0 lb

## 2014-02-20 DIAGNOSIS — Z23 Encounter for immunization: Secondary | ICD-10-CM

## 2014-02-20 DIAGNOSIS — R930 Abnormal findings on diagnostic imaging of skull and head, not elsewhere classified: Secondary | ICD-10-CM

## 2014-02-20 DIAGNOSIS — R200 Anesthesia of skin: Secondary | ICD-10-CM | POA: Insufficient documentation

## 2014-02-20 DIAGNOSIS — F17200 Nicotine dependence, unspecified, uncomplicated: Secondary | ICD-10-CM

## 2014-02-20 DIAGNOSIS — M810 Age-related osteoporosis without current pathological fracture: Secondary | ICD-10-CM

## 2014-02-20 DIAGNOSIS — Z720 Tobacco use: Secondary | ICD-10-CM

## 2014-02-20 DIAGNOSIS — R208 Other disturbances of skin sensation: Secondary | ICD-10-CM

## 2014-02-20 NOTE — Patient Instructions (Signed)
Set up brain MRI [facility]  Screening Chest CT [facility]  Dexa scan  Will refer to neurology  [place]

## 2014-02-20 NOTE — Progress Notes (Signed)
Subjective:    Patient ID: Dorothy [name], female    DOB: [birth year], 63 y.o.   MRN: [id number]  HPI  Prior OV: HM: UTD with mm and vaccines Pap today. Counseled regarding lung cancer screening guideline and I advised screeing CT. She declines now but would like ot have when she returns to [place]  Osteoporosis She is on Evista I counseled she needs DEXA but she is leaving for 2 months and requests to have this done in the fall. I advised pt to schedule to see me then  Abnormal MRI: Pt requests to have neurologist. Referral Will set up for her when she is back in town  Colon polyp Managed by Dr. Collene Mares  Hypothroidism Will check today  Tobacco use  Avascular necrosis let hip S/P THR  Vitamin D deficiency she is on daily supplement Will check today  Pt has appt scheduled for 9/9   Today's note:  Dorothy [name] [place]      Describes and episode 10 days ago of facial lnumbness no speech or visual changes no musle weakness. R sided facial numbness lasted 15 mins only.  No furthersymptoms since then.  Did not see evaluation.  She has h/o abnormal MRI 06/2010  With peri-ventricualr gliosis Does have history of intermittant numbness   Also has osteoporosis on EVista and would like a rhuematology opinion.  Fell and FX wrist while in mountains  Continued occasional cigarette use.     Allergies  Allergen Reactions  . Fosamax [Alendronate Sodium]     Poor dentition  , bone implant  . Latex Rash  . Penicillins Rash   Past Medical History  Diagnosis Date  . Thyroid disease   . Hyperlipidemia   . PONV (postoperative nausea and vomiting)   . Hypothyroidism   . Arthritis   . Osteopenia   . 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 Bruises easily     more on hands and arms   Past Surgical History  Procedure Laterality Date  . Ganglion cyst excision Right   . Dental surgery  1993  . Total hip arthroplasty Left 07/17/2012      Dr Mayer Camel  . Total hip arthroplasty Left 07/17/2012    Procedure: TOTAL HIP ARTHROPLASTY;  Surgeon: Kerin Salen, MD;  Location: [facility];  Service: Orthopedics;  Laterality: Left;   History   Social History  . Marital Status: Married    Spouse Name: N/A    Number of Children: N/A  . Years of Education: N/A   Occupational History  . Not on file.   Social History Main Topics  . Smoking status: Former Smoker -- 20 years    Types: Cigarettes    Quit date: 11/11/2011  . Smokeless tobacco: Never Used  . Alcohol Use: 0.6 oz/week    1 Glasses of wine per week     Comment: occassional  . Drug Use: No  . Sexual Activity: Yes   Other Topics Concern  . Not on file   Social History Narrative  . No narrative on file   Family History  Problem Relation Age of Onset  . Parkinson's disease Father   . Arthritis Maternal Grandmother    Patient Active Problem List   Diagnosis Date Noted  . Colon polyp 10/24/2013  . Vitamin D deficiency 10/18/2012  . Elevated liver function tests 10/18/2012  . Paresthesias 07/23/2012  . Fatty liver disease, nonalcoholic  07/23/2012  . Osteoporosis, unspecified 07/23/2012  . Avascular necrosis of hip 07/18/2012  . GERD (gastroesophageal reflux disease) 07/12/2012  . Avascular necrosis of bone of hip 07/12/2012  . Other and unspecified hyperlipidemia 07/12/2012  . Hypothyroidism 07/12/2012  . Abnormal MRI of head 07/12/2012   Current Outpatient Prescriptions on File Prior to Visit  Medication Sig Dispense Refill  . aspirin EC 81 MG tablet Take 81 mg by mouth daily.      . Calcium Carbonate-Vitamin D (CALCIUM 600+D) 600-400 MG-UNIT per tablet Take 2 tablets by mouth daily.      . Cholecalciferol (VITAMIN D) 2000 UNITS CAPS Take 1 capsule by mouth daily.      Marland Kitchen docusate sodium (COLACE) 100 MG capsule Take 100 mg by mouth every 6 (six) hours as needed for constipation.      Marland Kitchen levothyroxine (SYNTHROID, LEVOTHROID) 100 MCG tablet Take 1 tablet (100 mcg  total) by mouth daily.  90 tablet  3  . pantoprazole (PROTONIX) 40 MG tablet Take 1 tablet (40 mg total) by mouth daily.  60 tablet  3  . raloxifene (EVISTA) 60 MG tablet Take 1 tablet (60 mg total) by mouth daily.  90 tablet  3  . saccharomyces boulardii (FLORASTOR) 250 MG capsule Take 250 mg by mouth daily.      . simvastatin (ZOCOR) 20 MG tablet Take 1 tablet (20 mg total) by mouth every evening.  90 tablet  3  . valACYclovir (VALTREX) 500 MG tablet Take one tablet bid for 3 days for cold sore  90 tablet  1   No current facility-administered medications on file prior to visit.      Review of Systems    see HPI Objective:   Physical Exam  Physical Exam  Nursing note and vitals reviewed.  Constitutional: She is oriented to person, place, and time. She appears well-developed and well-nourished.  HENT:  Head: Normocephalic and atraumatic.  Cardiovascular: Normal rate and regular rhythm. Exam reveals no gallop and no friction rub.  No murmur heard.  Pulmonary/Chest: Breath sounds normal. She has no wheezes. She has no rales.  Neurological: She is alert and oriented to person, place, and time.  CNII-XII intact Reflexes 2+ symmetric  No pronator drift  Motor 5/5 UE and LE Sensory intact to monofilament Cerebellar intact FTN Skin: Skin is warm and dry.  Psychiatric: She has a normal mood and affect. Her behavior is normal.        Assessment & Plan:  Facial numbness intermittant symptoms over time.  Will get MRI  And refer to [facility] neurology  Tobacco use  Schedule screening Ct   Abnromal MRI  See above  I mentioned in [facility] pt has titanium left HIp   Osteoporosis  Get DEXA and will refer to rheumatology   Further management based on results  Osteoporosis

## 2014-03-05 ENCOUNTER — Other Ambulatory Visit: Payer: BC Managed Care – PPO

## 2014-03-13 ENCOUNTER — Ambulatory Visit
Admission: RE | Admit: 2014-03-13 | Discharge: 2014-03-13 | Disposition: A | Discharge status: Home / Self Care | Payer: No Typology Code available for payment source | Source: Ambulatory Visit | Attending: Internal Medicine | Admitting: Internal Medicine

## 2014-03-13 ENCOUNTER — Other Ambulatory Visit: Payer: BC Managed Care – PPO

## 2014-03-13 DIAGNOSIS — F17200 Nicotine dependence, unspecified, uncomplicated: Secondary | ICD-10-CM

## 2014-03-13 DIAGNOSIS — R930 Abnormal findings on diagnostic imaging of skull and head, not elsewhere classified: Secondary | ICD-10-CM

## 2014-03-13 MED ORDER — GADOBENATE DIMEGLUMINE 529 MG/ML IV SOLN
11.0000 mL | Freq: Once | INTRAVENOUS | Status: AC | PRN
  Administered 2014-03-13: 11 mL via INTRAVENOUS

## 2014-03-18 ENCOUNTER — Telehealth: Payer: Self-pay | Admitting: Internal Medicine

## 2014-03-18 NOTE — Telephone Encounter (Signed)
Left message on mobile and at home for pt to call office regarding CT and MRI results

## 2014-03-18 NOTE — Telephone Encounter (Signed)
Spoke with pt and informed of MRI resuts.  She has appt with neurology on 11/19  She does not wish to see an ENT right now regarding spot on trachea.  Advised to see me in December and I will re-address then

## 2014-03-20 ENCOUNTER — Telehealth: Payer: Self-pay | Admitting: Obstetrics & Gynecology

## 2014-03-20 NOTE — Telephone Encounter (Signed)
[facility] with Dr Lenna Gilford office calling to find out when pt's next bone density scan is in order to schedule an appointment with him. Her number is [phone].

## 2014-03-21 NOTE — Telephone Encounter (Signed)
Spoke with [employer]. Patient is being referred to Dr. Lenna Gilford, she is asking when patients next BMD is. Patient is scheduled for 04/04/14 at [facility]. Dr. Trudie Reed will need new copy of Dexa. Advised our office since is referring can send in updated Dexa when completed.

## 2014-04-04 ENCOUNTER — Ambulatory Visit
Admission: RE | Admit: 2014-04-04 | Discharge: 2014-04-04 | Disposition: A | Discharge status: Home / Self Care | Payer: BC Managed Care – PPO | Source: Ambulatory Visit | Attending: Internal Medicine | Admitting: Internal Medicine

## 2014-04-04 ENCOUNTER — Ambulatory Visit (INDEPENDENT_AMBULATORY_CARE_PROVIDER_SITE_OTHER): Payer: BC Managed Care – PPO | Admitting: Neurology

## 2014-04-04 ENCOUNTER — Encounter: Payer: Self-pay | Admitting: Neurology

## 2014-04-04 VITALS — BP 126/70 | HR 78 | Temp 97.6°F | Resp 18 | Ht 66.0 in | Wt 128.2 lb

## 2014-04-04 DIAGNOSIS — R200 Anesthesia of skin: Secondary | ICD-10-CM

## 2014-04-04 DIAGNOSIS — R930 Abnormal findings on diagnostic imaging of skull and head, not elsewhere classified: Secondary | ICD-10-CM

## 2014-04-04 DIAGNOSIS — M810 Age-related osteoporosis without current pathological fracture: Secondary | ICD-10-CM

## 2014-04-04 DIAGNOSIS — R937 Abnormal findings on diagnostic imaging of other parts of musculoskeletal system: Secondary | ICD-10-CM

## 2014-04-04 DIAGNOSIS — R208 Other disturbances of skin sensation: Secondary | ICD-10-CM

## 2014-04-04 LAB — HM DEXA SCAN

## 2014-04-04 NOTE — Progress Notes (Signed)
NEUROLOGY CONSULTATION NOTE  Dorothy [name] MRN: [id number] DOB: [birth year]  Referring provider: Dr. Coralyn Mark Primary care provider: Dr. Coralyn Mark  Reason for consult:  Right sided numbness, evaluate for MS  HISTORY OF PRESENT ILLNESS: Dorothy [name] is a 63 year old right-handed woman with hypothyroidism, hyperlipidemia, osteopenia, and arthritis who presents for right facial numbness.  Outside records, labs and MRI of the brain from 2015 and cervical spine from 2005 and 2011 personally reviewed.  In October, she developed a sharp head pain in the right parietal region, followed by numbness and tingling of the right side of her face.  It was brief.  She had an MRI of the brain with and without contrast performed on 03/13/14, which showed moderately extensive non-enhancing predominantly periventricular white matter lesions.  Contrast was not used in this study.  She had a similar episode of right facial and hemi-body numbness in August 2011, lasting 2 hours.  She had two other episodes later that year, lasting between 20 to 35 minutes.  On 04/23/10, she had an MRI of the cervical spine with and without contrast performed, which revealed focus of increased T2 signal in the dorsal columns of the cervical cord, extending from mid C2 through the C3-4 level, which was also seen on an MRI of the cervical spine performed in 2005.  She was evaluated by Dr. Leta Baptist at [facility].  MRI of the brain with and without contrast was performed on 07/08/10, and reportedly revealed mild to moderate periventricular gliosis around the periatrial and occipital horn regions with few scattered subcortical foci.    MRI of the thoracic spine with and without was normal.   She underwent an LP on 08/27/10.  CSF showed WBC 1, protein 50, glucose 68, IgG 3, no OCBs, Lyme negative, ACE 6, cytology negative, negative gram stain and culture, negative viral and fungal cultures.  Blood work, including  ANA, ANCA, RPR, ACE, HIV, and hepatitis panel were reportedly negative.  It was decided to monitor and repeat imaging in one year. She was found to have B12 deficiency with prior level from 11/24/11 of 252.  She says she was previously treated.  Except for the most recent event, they have never been associated with headache.  She denies history of headache or seizures.  She has no family history of MS.  Her mother had headaches related to TMJ dysfunction.  Her sister had an isolated seizure as a small child due to PCN allergy.  She reports that she fell off a horse and hit her head about 20 years ago.  For the next 8 months, she noted sharp electric pain running down her spine whenever she would flex her neck.  This has since resolved.  More recently, she has been experiencing pins and needles sensation involving the left hand, which wakes her up at night.  She attributes this to sleeping on her left side, so she turns over and it resolves.  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  It can involve the second to fourth fingers or only the fifth finger.  There is no associated weakness or neck pain.  Labs from 10/24/13 showed TSH 1.738 and vitamin D 25-hydroxy of 46.  PAST MEDICAL HISTORY: Past Medical History  Diagnosis Date  . Thyroid disease   . Hyperlipidemia   . PONV (postoperative nausea and vomiting)   . Hypothyroidism   . Arthritis   . Osteopenia   . Paresthesia  evaluated for hemi paresthisia ; no MS; resolving  . Bruises easily     more on hands and arms  . Left wrist fracture 12/10/2013    PAST SURGICAL HISTORY: Past Surgical History  Procedure Laterality Date  . Ganglion cyst excision Right   . Dental surgery  1993  . 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 Total hip arthroplasty Left 07/17/2012    Procedure: TOTAL HIP ARTHROPLASTY;  Surgeon: Kerin Salen, MD;  Location: [place];  Service: Orthopedics;  Laterality:  Left;    MEDICATIONS: Current Outpatient Prescriptions on File Prior to Visit  Medication Sig Dispense Refill  . aspirin EC 81 MG tablet Take 81 mg by mouth daily.    . Calcium Carbonate-Vitamin D (CALCIUM 600+D) 600-400 MG-UNIT per tablet Take 2 tablets by mouth daily.    . Cholecalciferol (VITAMIN D) 2000 UNITS CAPS Take 1 capsule by mouth daily.    Marland Kitchen docusate sodium (COLACE) 100 MG capsule Take 100 mg by mouth every 6 (six) hours as needed for constipation.    Marland Kitchen levothyroxine (SYNTHROID, LEVOTHROID) 100 MCG tablet Take 1 tablet (100 mcg total) by mouth daily. 90 tablet 3  . pantoprazole (PROTONIX) 40 MG tablet Take 1 tablet (40 mg total) by mouth daily. 60 tablet 3  . raloxifene (EVISTA) 60 MG tablet Take 1 tablet (60 mg total) by mouth daily. 90 tablet 3  . saccharomyces boulardii (FLORASTOR) 250 MG capsule Take 250 mg by mouth daily.    . simvastatin (ZOCOR) 20 MG tablet Take 1 tablet (20 mg total) by mouth every evening. 90 tablet 3  . valACYclovir (VALTREX) 500 MG tablet Take one tablet bid for 3 days for cold sore 90 tablet 1   No current facility-administered medications on file prior to visit.    ALLERGIES: Allergies  Allergen Reactions  . Fosamax [Alendronate Sodium]     Poor dentition  , bone implant  . Latex Rash  . Penicillins Rash    FAMILY HISTORY: Family History  Problem Relation Age of Onset  . Parkinson's disease Father   . Arthritis Maternal Grandmother     SOCIAL HISTORY: History   Social History  . Marital Status: Married    Spouse Name: N/A    Number of Children: N/A  . Years of Education: N/A   Occupational History  . Not on file.   Social History Main Topics  . Smoking status: Former Smoker -- 20 years    Types: Cigarettes    Quit date: 11/11/2011  . Smokeless tobacco: Never Used  . Alcohol Use: 0.6 oz/week    1 Glasses of wine per week     Comment: occassional  . Drug Use: No  . Sexual Activity:    Partners: Male   Other Topics  Concern  . Not on file   Social History Narrative    REVIEW OF SYSTEMS: Constitutional: No fevers, chills, or sweats, no generalized fatigue, change in appetite Eyes: No visual changes, double vision, eye pain Ear, nose and throat: No hearing loss, ear pain, nasal congestion, sore throat Cardiovascular: No chest pain, palpitations Respiratory:  No shortness of breath at rest or with exertion, wheezes GastrointestinaI: No nausea, vomiting, diarrhea, abdominal pain, fecal incontinence Genitourinary:  No dysuria, urinary retention or frequency Musculoskeletal:  No neck pain, back pain Integumentary: No rash, pruritus, skin lesions Neurological: as above Psychiatric: No depression, insomnia, anxiety Endocrine: No palpitations, fatigue, diaphoresis, mood swings, change in appetite, change in weight,  increased thirst Hematologic/Lymphatic:  No anemia, purpura, petechiae. Allergic/Immunologic: no itchy/runny eyes, nasal congestion, recent allergic reactions, rashes  PHYSICAL EXAM: Filed Vitals:   04/04/14 0920  BP: 126/70  Pulse: 78  Temp: 97.6 F (36.4 C)  Resp: 18   General: No acute distress Head:  Normocephalic/atraumatic Eyes:  fundi unremarkable, without vessel changes, exudates, hemorrhages or papilledema. CN III, IV, VI:  full range of motion, no nystagmus, no ptosis Neck: supple, no paraspinal tenderness, full range of motion Back: No paraspinal tenderness Heart: regular rate and rhythm Lungs: Clear to auscultation bilaterally. Vascular: No carotid bruits. Neurological Exam: Mental status: alert and oriented to person, place, and time, recent and remote memory intact, fund of knowledge intact, attention and concentration intact, speech fluent and not dysarthric, language intact. Cranial nerves: CN I: not tested CN II: pupils equal, round and reactive to light, visual fields intact, fundi unremarkable, without vessel changes, exudates, hemorrhages or papilledema. CN III,  IV, VI:  full range of motion, no nystagmus, no ptosis CN V: facial sensation intact CN VII: upper and lower face symmetric CN VIII: hearing intact CN IX, X: gag intact, uvula midline CN XI: sternocleidomastoid and trapezius muscles intact CN XII: tongue midline Bulk & Tone: normal, no fasciculations. Motor:  5/5 throughout Sensation:  Temperature and vibration intact Deep Tendon Reflexes:  3+ throughout, toes downgoing Finger to nose testing:  Mild postural and kinetic tremor.  No dysmetria Gait:  Normal station and stride.  Able to turn, walk on toes, heels and in tandem. Romberg negative.  IMPRESSION: 1.  Recurrent episodes of right sided numbness involving the entire body or more recently just the face.  Etiology unknown.  Differential diagnosis may be TIA, migraine, seizure or demyelinating event.  Except for recent episode, they have never been associated with headache and she has no prior history of headache, which makes migraine less likely.  Recurrent TIA wouldn't present with the same symptoms over several years.  I really don't suspect seizure.  Regarding possibility of MS:  I really cannot say she has MS at this time.  She does have extensive periventricular white matter signal abnormality along the temporal horns.  However, she clinically has been stable overall.  The findings on the cervical spine could be scarring from her prior head trauma or related to B12 deficiency.  Also, it appears stable when compared to prior imaging from 2005.   I don't have the prior brain MRI scans from [facility] to review and compare to recent imaging to see if there has been any progression of disease.   2.  Left hand numbness, may be ulnar neuropathy or carpal tunnel syndrome PLAN: I will repeat MRI of the cervical spine with and without contrast to evaluate for progression I asked her to bring me the CD of the brain MRI from [place] so I can review and compare to recent scan, looking  for progression I would like to check B12, copper and ceruloplasmin levels.  She would like to have these labs done when she sees Dr. Coralyn Mark next week. Unless there is evidence of significant progression of white matter disease, I would probably not start a disease modifying agent even if I did suspect MS since the symptoms are so mild and clinically she has not had any significant progression.  I would think the side effects of these drugs would outweigh the benefits.  If the hand numbness gets worse, we can perform NCV-EMG She will follow up in 3  months.  I answered all questions to the best of my ability.  Thank you for allowing me to take part in the care of this patient.  Metta Clines, DO  CC:  Emi Belfast, MD

## 2014-04-04 NOTE — Addendum Note (Signed)
Addended by: Charyl Bigger E on: 04/04/2014 12:16 PM   Modules accepted: Orders

## 2014-04-04 NOTE — Patient Instructions (Addendum)
I would like to check some blood tests, namely B12 and copper levels. I would also like to check another MRI of the cervical spine with and without contrast to compare Please bring a copy of the MRI of the brain from [place] in 2012 so I can compare [place] 12/2 at 1:45pm Follow up in 3 months.

## 2014-04-05 ENCOUNTER — Telehealth: Payer: Self-pay | Admitting: Internal Medicine

## 2014-04-05 NOTE — Telephone Encounter (Signed)
Dexa bone density faxed to [facility] at Dr. Trudie Reed office with fax confirmation received.  Routing to provider for final review. Patient agreeable to disposition. Will close encounter

## 2014-04-05 NOTE — Telephone Encounter (Signed)
Dorothy [name] wants to get labs that Dr. Tomi Likens her neurologist wants when she sees me next in office.  Call pt and be sure to get a written order from Dr. Georgie Chard office so results go directly to him and not me

## 2014-04-08 NOTE — Progress Notes (Signed)
I left a message for Kimmarie in regards to her Bone Density results-eh

## 2014-04-08 NOTE — Telephone Encounter (Signed)
I left Makana a message to call me back in regards to labs for Dr. Aaron Mose

## 2014-04-10 ENCOUNTER — Encounter: Payer: Self-pay | Admitting: Diagnostic Neuroimaging

## 2014-04-15 ENCOUNTER — Telehealth: Payer: Self-pay

## 2014-04-15 NOTE — Telephone Encounter (Signed)
Ema Hebner [phone]  Mickel Baas called and canceled her appointment for tomorrow, she has MRI scheduled for Wednesday and appointment with thorac surgeon on Friday I think and she needs to get her previous records along with some other stuff to Dr Tomi Likens. She also stated that Dr Tomi Likens would like some labs done for Vit B12 and copper. Mickel Baas will call back and schedule an appointment when she has completed some of these things, so you can discuss all with her.

## 2014-04-16 ENCOUNTER — Ambulatory Visit: Payer: BC Managed Care – PPO | Admitting: Internal Medicine

## 2014-04-17 ENCOUNTER — Ambulatory Visit
Admission: RE | Admit: 2014-04-17 | Discharge: 2014-04-17 | Disposition: A | Discharge status: Home / Self Care | Payer: BC Managed Care – PPO | Source: Ambulatory Visit | Attending: Neurology | Admitting: Neurology

## 2014-04-17 DIAGNOSIS — R200 Anesthesia of skin: Secondary | ICD-10-CM

## 2014-04-17 DIAGNOSIS — R937 Abnormal findings on diagnostic imaging of other parts of musculoskeletal system: Secondary | ICD-10-CM

## 2014-04-17 DIAGNOSIS — R930 Abnormal findings on diagnostic imaging of skull and head, not elsewhere classified: Secondary | ICD-10-CM

## 2014-04-17 MED ORDER — GADOBENATE DIMEGLUMINE 529 MG/ML IV SOLN
11.0000 mL | Freq: Once | INTRAVENOUS | Status: AC | PRN
  Administered 2014-04-17: 11 mL via INTRAVENOUS

## 2014-07-09 ENCOUNTER — Encounter: Payer: Self-pay | Admitting: Neurology

## 2014-07-09 ENCOUNTER — Ambulatory Visit (INDEPENDENT_AMBULATORY_CARE_PROVIDER_SITE_OTHER): Payer: BLUE CROSS/BLUE SHIELD | Admitting: Neurology

## 2014-07-09 VITALS — BP 104/70 | HR 94 | Temp 97.7°F | Resp 18 | Ht 66.0 in | Wt 129.7 lb

## 2014-07-09 DIAGNOSIS — R200 Anesthesia of skin: Secondary | ICD-10-CM

## 2014-07-09 DIAGNOSIS — G379 Demyelinating disease of central nervous system, unspecified: Secondary | ICD-10-CM

## 2014-07-09 DIAGNOSIS — R202 Paresthesia of skin: Secondary | ICD-10-CM

## 2014-07-09 LAB — VITAMIN B12: Vitamin B-12: 383 pg/mL (ref 211–911)

## 2014-07-09 NOTE — Patient Instructions (Addendum)
It is possible that you may have a very mild form of MS.  However, with your history of neck injury, it makes the diagnosis a little more difficult.  No matter what, even if it is MS, I don't feel we need to start an MS drug as you have overall been stable over the past several years based on symptoms and MRI.  I want to check B12, ceruloplasmin and copper.  I will see you in 6 months.

## 2014-07-09 NOTE — Progress Notes (Signed)
NEUROLOGY FOLLOW UP OFFICE NOTE  Dorothy [name] [id number]  HISTORY OF PRESENT ILLNESS: Dorothy [name] is a 64 year old right-handed woman with hypothyroidism, hyperlipidemia, osteopenia, and arthritis who presents for right facial numbness.    UPDATE: She had an MRI of the cervical spine with and without contrast which showed non-enhancing hyperintensity in the cervical dorsal column, most pronounced at C1-C4.  It appears stable when compared to prior imaging from 2011.  B12, copper and ceruloplasmin were not checked.    She repositioned her arm when she sleeps and now she no longer feels the numbness and tingling in the arm.  When she is reading in bed with her neck flexed, she feels hypersensitivity and tingling involving her thorax.  HISTORY: In October, she developed a sharp head pain in the right parietal region, followed by numbness and tingling of the right side of her face.  It was brief.  She had an MRI of the brain with and without contrast performed on 03/13/14, which showed moderately extensive non-enhancing predominantly periventricular white matter lesions.  Contrast was not used in this study.  She had a similar episode of right facial and hemi-body numbness in August 2011, lasting 2 hours.  She had two other episodes later that year, lasting between 20 to 35 minutes.  On 04/23/10, she had an MRI of the cervical spine with and without contrast performed, which revealed focus of increased T2 signal in the dorsal columns of the cervical cord, extending from mid C2 through the C3-4 level, which was also seen on an MRI of the cervical spine performed in 2005.  She was evaluated by Dr. Leta Baptist at [facility].  MRI of the brain with and without contrast was performed on 07/08/10, and reportedly revealed mild to moderate periventricular gliosis around the periatrial and occipital horn regions with few scattered subcortical foci.    MRI of the thoracic spine with  and without was normal.   She underwent an LP on 08/27/10.  CSF showed WBC 1, protein 50, glucose 68, IgG 3, no OCBs, Lyme negative, ACE 6, cytology negative, negative gram stain and culture, negative viral and fungal cultures.  Blood work, including ANA, ANCA, RPR, ACE, HIV, and hepatitis panel were reportedly negative.  It was decided to monitor and repeat imaging in one year. She was found to have B12 deficiency with prior level from 11/24/11 of 252.  She says she was previously treated.  Except for the most recent event, they have never been associated with headache.  She denies history of headache or seizures.  She has no family history of MS.  Her mother had headaches related to TMJ dysfunction.  Her sister had an isolated seizure as a small child due to PCN allergy.  She reports that she fell off a horse and hit her head about 20 years ago.  For the next 8 months, she noted sharp electric pain running down her spine whenever she would flex her neck.  This has since resolved.  More recently, she has been experiencing pins and needles sensation involving the left hand, which wakes her up at night.  She attributes this to sleeping on her left side, so she turns over and it resolves.  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  It can involve the second to fourth fingers or only the fifth finger.  There is no associated weakness or neck pain.  Labs from 10/24/13 showed TSH  1.738 and vitamin D 25-hydroxy of 46.  PAST MEDICAL HISTORY: Past Medical History  Diagnosis Date  . Thyroid disease   . Hyperlipidemia   . PONV (postoperative nausea and vomiting)   . Hypothyroidism   . Arthritis   . Osteopenia   . 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 Bruises easily     more on hands and arms  . Left wrist fracture 12/10/2013    MEDICATIONS: Current Outpatient Prescriptions on File Prior to Visit  Medication Sig Dispense Refill    . aspirin EC 81 MG tablet Take 81 mg by mouth daily.    . Calcium Carbonate-Vitamin D (CALCIUM 600+D) 600-400 MG-UNIT per tablet Take 2 tablets by mouth daily.    . Cholecalciferol (VITAMIN D) 2000 UNITS CAPS Take 1 capsule by mouth daily.    Marland Kitchen docusate sodium (COLACE) 100 MG capsule Take 100 mg by mouth every 6 (six) hours as needed for constipation.    Marland Kitchen levothyroxine (SYNTHROID, LEVOTHROID) 100 MCG tablet Take 1 tablet (100 mcg total) by mouth daily. 90 tablet 3  . pantoprazole (PROTONIX) 40 MG tablet Take 1 tablet (40 mg total) by mouth daily. 60 tablet 3  . raloxifene (EVISTA) 60 MG tablet Take 1 tablet (60 mg total) by mouth daily. 90 tablet 3  . saccharomyces boulardii (FLORASTOR) 250 MG capsule Take 250 mg by mouth daily.    . simvastatin (ZOCOR) 20 MG tablet Take 1 tablet (20 mg total) by mouth every evening. 90 tablet 3  . valACYclovir (VALTREX) 500 MG tablet Take one tablet bid for 3 days for cold sore 90 tablet 1   No current facility-administered medications on file prior to visit.    ALLERGIES: Allergies  Allergen Reactions  . Fosamax [Alendronate Sodium]     Poor dentition  , bone implant  . Latex Rash  . Penicillins Rash    FAMILY HISTORY: Family History  Problem Relation Age of Onset  . Parkinson's disease Father   . Arthritis Maternal Grandmother     SOCIAL HISTORY: History   Social History  . Marital Status: Married    Spouse Name: N/A  . Number of Children: N/A  . Years of Education: N/A   Occupational History  . Not on file.   Social History Main Topics  . Smoking status: Former Smoker -- 20 years    Types: Cigarettes    Quit date: 11/11/2011  . Smokeless tobacco: Never Used  . Alcohol Use: 0.6 oz/week    1 Glasses of wine per week     Comment: occassional  . Drug Use: No  . Sexual Activity:    Partners: Male   Other Topics Concern  . Not on file   Social History Narrative    REVIEW OF SYSTEMS: Constitutional: No fevers, chills, or  sweats, no generalized fatigue, change in appetite Eyes: No visual changes, double vision, eye pain Ear, nose and throat: No hearing loss, ear pain, nasal congestion, sore throat Cardiovascular: No chest pain, palpitations Respiratory:  No shortness of breath at rest or with exertion, wheezes GastrointestinaI: No nausea, vomiting, diarrhea, abdominal pain, fecal incontinence Genitourinary:  No dysuria, urinary retention or frequency Musculoskeletal:  No neck pain, back pain Integumentary: No rash, pruritus, skin lesions Neurological: as above Psychiatric: No depression, insomnia, anxiety Endocrine: No palpitations, fatigue, diaphoresis, mood swings, change in appetite, change in weight, increased thirst Hematologic/Lymphatic:  No anemia, purpura, petechiae. Allergic/Immunologic: no itchy/runny eyes, nasal congestion, recent  allergic reactions, rashes  PHYSICAL EXAM: Filed Vitals:   07/09/14 1345  BP: 104/70  Pulse: 94  Temp: 97.7 F (36.5 C)  Resp: 18   General: No acute distress Head:  Normocephalic/atraumatic Eyes:  Fundoscopic exam unremarkable without vessel changes, exudates, hemorrhages or papilledema. Neck: supple, no paraspinal tenderness, full range of motion Heart:  Regular rate and rhythm Lungs:  Clear to auscultation bilaterally Back: No paraspinal tenderness Neurological Exam: alert and oriented to person, place, and time, recent and remote memory intact, fund of knowledge intact, attention and concentration intact, speech fluent and not dysarthric, language intact.  CN II-XII intact.  Bulk and tone normal.  Muscle strength 5/5 throughout.  Sensation to light touch intact.  Deep tendon reflexes 3+ throughout.  Mild postural and kinetic tremor on finger to nose testing.  Normal station and stride.    IMPRESSION: Recurrent episodes of right sided numbness.  Etiology unclear.   MRI results suggest a demyelinating disease.  Her symptoms may be residual from prior neck  injury.  If this is demyelinating disease, it is very mild and not active.  Therefore, I wouldn't start a disease--modifying agent.   PLAN: Continue to monitor. For sake of completeness, will check B12, ceruloplasmin and copper. Follow up in 6 months.  Metta Clines, DO  CC: Emi Belfast, MD

## 2014-07-11 LAB — COPPER, SERUM: Copper: 102 ug/dL (ref 70–175)

## 2014-07-11 LAB — CERULOPLASMIN: Ceruloplasmin: 26 mg/dL (ref 18–53)

## 2014-07-12 ENCOUNTER — Telehealth: Payer: Self-pay | Admitting: *Deleted

## 2014-07-12 NOTE — Telephone Encounter (Signed)
Patient returning your call  Call back number [phone]

## 2014-07-12 NOTE — Telephone Encounter (Signed)
Patient is aware of normal labs  

## 2014-07-18 ENCOUNTER — Other Ambulatory Visit: Payer: Self-pay | Admitting: Internal Medicine

## 2014-07-18 NOTE — Telephone Encounter (Signed)
Refill request

## 2014-08-12 ENCOUNTER — Telehealth: Payer: Self-pay | Admitting: Internal Medicine

## 2014-08-20 ENCOUNTER — Other Ambulatory Visit: Payer: Self-pay | Admitting: Emergency Medicine

## 2014-08-20 ENCOUNTER — Other Ambulatory Visit: Payer: Self-pay

## 2014-08-20 DIAGNOSIS — Z1231 Encounter for screening mammogram for malignant neoplasm of breast: Secondary | ICD-10-CM

## 2014-09-04 ENCOUNTER — Ambulatory Visit (INDEPENDENT_AMBULATORY_CARE_PROVIDER_SITE_OTHER): Payer: BLUE CROSS/BLUE SHIELD | Admitting: Internal Medicine

## 2014-09-04 ENCOUNTER — Ambulatory Visit: Payer: BLUE CROSS/BLUE SHIELD

## 2014-09-04 ENCOUNTER — Encounter: Payer: Self-pay | Admitting: Internal Medicine

## 2014-09-04 VITALS — BP 115/68 | HR 80 | Resp 16 | Ht 66.0 in | Wt 130.0 lb

## 2014-09-04 DIAGNOSIS — M810 Age-related osteoporosis without current pathological fracture: Secondary | ICD-10-CM

## 2014-09-04 DIAGNOSIS — Z760 Encounter for issue of repeat prescription: Secondary | ICD-10-CM

## 2014-09-04 DIAGNOSIS — E038 Other specified hypothyroidism: Secondary | ICD-10-CM | POA: Diagnosis not present

## 2014-09-04 MED ORDER — SIMVASTATIN 10 MG PO TABS: ORAL_TABLET | ORAL | Status: DC

## 2014-09-04 MED ORDER — LEVOTHYROXINE SODIUM 100 MCG PO TABS: 100.0000 ug | ORAL_TABLET | Freq: Every day | ORAL | Status: DC

## 2014-09-04 MED ORDER — PANTOPRAZOLE SODIUM 40 MG PO TBEC: DELAYED_RELEASE_TABLET | ORAL | Status: DC

## 2014-09-04 NOTE — Progress Notes (Addendum)
Subjective:    Patient ID: Dorothy [name], female    DOB: 04-05-1951, 64 y.o.   MRN: [id number]  HPI 07/09/2014 neurology note IMPRESSION: Recurrent episodes of right sided numbness. Etiology unclear. MRI results suggest a demyelinating disease. Her symptoms may be residual from prior neck injury. If this is demyelinating disease, it is very mild and not active. Therefore, I wouldn't start a disease--modifying agent.   PLAN: Continue to monitor. For sake of completeness, will check B12, ceruloplasmin and copper. Follow up in 6 months.  Metta Clines, DO  My note 02/2014  Facial numbness intermittant symptoms over time. Will get MRI And refer to [facility] neurology  Tobacco use Schedule screening Ct   Abnromal MRI See above I mentioned in [facility] pt has titanium left HIp   Osteoporosis Get DEXA and will refer to rheumatology   Further management based on results   TODAY  Dorothy [name] is here for follow up of several issues  Recurrent paresthesia  No further episodes of numbness  Osteoporosis  She has seen Dr. Trudie Reed and thinks she would like to try an oral med alternative to Fosamax  ?? Tracheal lesion on CT 02/2014  Findings  Extensively reviewed with pt   Allergies  Allergen Reactions  . Fosamax [Alendronate Sodium]     Poor dentition  , bone implant  . Latex Rash  . Penicillins Rash   Past Medical History  Diagnosis Date  . Thyroid disease   . Hyperlipidemia   . PONV (postoperative nausea and vomiting)   . Hypothyroidism   . Arthritis   . Osteopenia   . 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 Bruises easily     more on hands and arms  . Left wrist fracture 12/10/2013   Past Surgical History  Procedure Laterality Date  . Ganglion cyst excision Right   . Dental surgery  1993  . 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 Total hip arthroplasty Left 07/17/2012    Procedure: TOTAL HIP ARTHROPLASTY;  Surgeon: Kerin Salen, MD;  Location: [place];  Service: Orthopedics;  Laterality: Left;   History   Social History  . Marital Status: Married    Spouse Name: N/A  . Number of Children: N/A  . Years of Education: N/A   Occupational History  . Not on file.   Social History Main Topics  . Smoking status: Former Smoker -- 20 years    Types: Cigarettes    Quit date: 11/11/2011  . Smokeless tobacco: Never Used  . Alcohol Use: 0.6 oz/week    1 Glasses of wine per week     Comment: occassional  . Drug Use: No  . Sexual Activity:    Partners: Male   Other Topics Concern  . Not on file   Social History Narrative   Family History  Problem Relation Age of Onset  . Parkinson's disease Father   . Arthritis Maternal Grandmother    Patient Active Problem List   Diagnosis Date Noted  . Right facial numbness 02/20/2014  . Colon polyp 10/24/2013  . Vitamin D deficiency 10/18/2012  . Elevated liver function tests 10/18/2012  . Paresthesias 07/23/2012  . Fatty liver disease, nonalcoholic 36/14/4315  . Osteoporosis, unspecified 07/23/2012  . Avascular necrosis of hip 07/18/2012  . GERD (gastroesophageal reflux disease) 07/12/2012  . Avascular necrosis of bone of hip 07/12/2012  . Other and unspecified hyperlipidemia 07/12/2012  .  Hypothyroidism 07/12/2012  . Abnormal MRI of head 07/12/2012   Current Outpatient Prescriptions on File Prior to Visit  Medication Sig Dispense Refill  . aspirin EC 81 MG tablet Take 81 mg by mouth daily.    . Calcium Carbonate-Vitamin D (CALCIUM 600+D) 600-400 MG-UNIT per tablet Take 2 tablets by mouth daily.    . Cholecalciferol (VITAMIN D) 2000 UNITS CAPS Take 1 capsule by mouth daily.    Marland Kitchen docusate sodium (COLACE) 100 MG capsule Take 100 mg by mouth every 6 (six) hours as needed for constipation.    Marland Kitchen levothyroxine (SYNTHROID, LEVOTHROID) 100 MCG tablet Take 1 tablet (100 mcg total) by mouth daily. 90 tablet 3  . pantoprazole (PROTONIX) 40 MG tablet TAKE 1 BY MOUTH  DAILY 60 tablet 0  . raloxifene (EVISTA) 60 MG tablet Take 1 tablet (60 mg total) by mouth daily. 90 tablet 3  . saccharomyces boulardii (FLORASTOR) 250 MG capsule Take 250 mg by mouth daily.    . simvastatin (ZOCOR) 20 MG tablet Take 1 tablet (20 mg total) by mouth every evening. 90 tablet 3  . valACYclovir (VALTREX) 500 MG tablet TAKE 1 BY MOUTH TWICE DAILY FOR THREE DAYS FOR COLD SORE 15 tablet 0   No current facility-administered medications on file prior to visit.       Review of Systems See HPI    Objective:   Physical Exam  Physical Exam  Nursing note and vitals reviewed.  Constitutional: She is oriented to person, place, and time. She appears well-developed and well-nourished.  HENT:  Head: Normocephalic and atraumatic.  Cardiovascular: Normal rate and regular rhythm. Exam reveals no gallop and no friction rub.  No murmur heard.  Pulmonary/Chest: Breath sounds normal. She has no wheezes. She has no rales.  Neurological: She is alert and oriented to person, place, and time.  Skin: Skin is warm and dry.  Psychiatric: She has a normal mood and affect. Her behavior is normal.    A/P    Osteoporosis  She would like to try oral  alternative to Fosamax as she could not tolerate  She is afraid of SE profile of injectables  Advised to follow with rheumatolgoy  Recurrent Right sided numbness:  Followed by neurology   Hypothyroidsim  Continue meds   CT lesion ant wall of trachea  I reveiwed finding with pt and advised  To be sure to have follow up CT in October. She is aware that new PCP will have to order this.  Pt given copy of CT report  Pt received letter about my departure   And advised to schedule this week an  appt with new provider of choice

## 2014-09-05 ENCOUNTER — Ambulatory Visit
Admission: RE | Admit: 2014-09-05 | Discharge: 2014-09-05 | Disposition: A | Discharge status: Home / Self Care | Payer: BLUE CROSS/BLUE SHIELD | Source: Ambulatory Visit

## 2014-09-05 DIAGNOSIS — Z1231 Encounter for screening mammogram for malignant neoplasm of breast: Secondary | ICD-10-CM

## 2014-09-09 ENCOUNTER — Other Ambulatory Visit: Payer: Self-pay | Admitting: Internal Medicine

## 2014-10-01 ENCOUNTER — Telehealth: Payer: Self-pay | Admitting: *Deleted

## 2014-10-01 NOTE — Telephone Encounter (Signed)
Unable to reach patient at time of Pre-Visit Call.  Left message for patient to return call when available.    

## 2014-10-02 ENCOUNTER — Encounter: Payer: Self-pay | Admitting: Family

## 2014-10-02 ENCOUNTER — Ambulatory Visit (INDEPENDENT_AMBULATORY_CARE_PROVIDER_SITE_OTHER): Payer: BLUE CROSS/BLUE SHIELD | Admitting: Family

## 2014-10-02 VITALS — BP 100/62 | HR 78 | Temp 97.7°F | Resp 16 | Ht 66.0 in | Wt 131.2 lb

## 2014-10-02 DIAGNOSIS — M810 Age-related osteoporosis without current pathological fracture: Secondary | ICD-10-CM

## 2014-10-02 DIAGNOSIS — K219 Gastro-esophageal reflux disease without esophagitis: Secondary | ICD-10-CM

## 2014-10-02 DIAGNOSIS — B002 Herpesviral gingivostomatitis and pharyngotonsillitis: Secondary | ICD-10-CM | POA: Diagnosis not present

## 2014-10-02 DIAGNOSIS — E039 Hypothyroidism, unspecified: Secondary | ICD-10-CM

## 2014-10-02 NOTE — Progress Notes (Signed)
Pre visit review using our clinic review tool, if applicable. No additional management support is needed unless otherwise documented below in the visit note. 

## 2014-10-02 NOTE — Progress Notes (Signed)
Subjective:    Patient ID: Dorothy [name], female    DOB: Jun 06, 1950, 64 y.o.   MRN: [id number]  HPI   [name] is a 64 yr old female who presents today to establish care.   Chart reviewed- noted 55mm lesion anterior wall of trachea on CT chest which was performed 03/13/14  GERD-  On protonix.  Previously on aciphex and was off for a number of years then was placed on protonix.  Reports symptoms are stab63 yr olle.    Osteoporosis-  Pt is on actonel.   Hypothyroid- on synthroid-  Lab Results  Component Value Date   TSH 1.738 10/24/2013   Oral herpes- uses valtex prn.     Review of Systems  Constitutional: Negative for unexpected weight change.  HENT: Negative for hearing loss and rhinorrhea.   Eyes: Negative for visual disturbance.  Respiratory: Negative for cough.   Cardiovascular: Negative for leg swelling.  Gastrointestinal: Negative for nausea and diarrhea.       Some chronic constipation- uses probiotics and colace  Genitourinary: Negative for dysuria and frequency.  Musculoskeletal: Negative for myalgias and arthralgias.  Skin: Negative for rash.  Neurological: Negative for headaches.  Hematological: Negative for adenopathy.  Psychiatric/Behavioral: Negative for dysphoric mood and agitation.   Past Medical History  Diagnosis Date  . Thyroid disease   . Hyperlipidemia   . PONV (postoperative nausea and vomiting)   . Hypothyroidism   . Arthritis   . Osteopenia   . 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 Bruises easily     more on hands and arms  . Left wrist fracture 12/10/2013  . Osteoporosis   . Allergy     seasonal  . GERD (gastroesophageal reflux disease)   . History of chicken pox     History   Social History  . Marital Status: Married    Spouse Name: N/A  . Number of Children: N/A  . Years of Education: N/A   Occupational History  . Not on file.   Social History Main Topics  . Smoking status: Former  Smoker -- 20 years    Types: Cigarettes    Quit date: 11/11/2011  . Smokeless tobacco: Never Used  . Alcohol Use: 3.0 - 4.2 oz/week    5-7 Glasses of wine per week     Comment: occassional  . Drug Use: No  . Sexual Activity:    Partners: Male   Other Topics Concern  . 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 Ganglion cyst excision Right 2000  . Dental surgery  1993  . 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 Total hip arthroplasty Left 07/17/2012    Procedure: TOTAL HIP ARTHROPLASTY;  Surgeon: Kerin Salen, MD;  Location: [place];  Service: Orthopedics;  Laterality: Left;    Family History  Problem Relation Age of Onset  . Parkinson's disease Father   . Alcohol abuse Father   . Arthritis Maternal Grandmother     Allergies  Allergen Reactions  . Fosamax [Alendronate Sodium]     Poor dentition  , bone implant  . Latex Rash  . Penicillins Rash    Current Outpatient Prescriptions  on File Prior to Visit  Medication Sig Dispense Refill  . aspirin EC 81 MG tablet Take 81 mg by mouth daily.    . Calcium Carbonate-Vitamin D (CALCIUM 600+D) 600-400 MG-UNIT per tablet Take 2 tablets by mouth daily.    . Cholecalciferol (VITAMIN D) 2000 UNITS CAPS Take 1 capsule by mouth daily.    Marland Kitchen docusate sodium (COLACE) 100 MG capsule Take 100 mg by mouth every 6 (six) hours as needed for constipation.    Marland Kitchen levothyroxine (SYNTHROID, LEVOTHROID) 100 MCG tablet Take 1 tablet (100 mcg total) by mouth daily. 90 tablet 3  . pantoprazole (PROTONIX) 40 MG tablet TAKE 1 BY MOUTH DAILY 90 tablet 1  . saccharomyces boulardii (FLORASTOR) 250 MG capsule Take 250 mg by mouth daily.    . simvastatin (ZOCOR) 10 MG tablet Take one every evening 90 tablet 1  . valACYclovir  (VALTREX) 500 MG tablet TAKE 1 BY MOUTH TWICE DAILY FOR THREE DAYS FOR COLD SORE 15 tablet 0   No current facility-administered medications on file prior to visit.    BP 100/62 mmHg  Pulse 78  Temp(Src) 97.7 F (36.5 C) (Oral)  Resp 16  Ht 5\' 6"  (1.676 m)  Wt 131 lb 3.2 oz (59.512 kg)  BMI 21.19 kg/m2  SpO2 97%        Objective:   Physical Exam  Constitutional: She is oriented to person, place, and time. She appears well-developed and well-nourished.  HENT:  Head: Normocephalic and atraumatic.  Eyes: No scleral icterus.  Neck: No thyromegaly present.  Cardiovascular: Normal rate, regular rhythm and normal heart sounds.   No murmur heard. Pulmonary/Chest: Effort normal and breath sounds normal. No respiratory distress. She has no wheezes.  Musculoskeletal: She exhibits no edema.  Lymphadenopathy:    She has no cervical adenopathy.  Neurological: She is alert and oriented to person, place, and time.  Psychiatric: She has a normal mood and affect. Her behavior is normal. Judgment and thought content normal.          Assessment & Plan:

## 2014-10-02 NOTE — Patient Instructions (Signed)
Please complete lab work prior to leaving.  Welcome to [place]! 

## 2014-10-03 ENCOUNTER — Encounter: Payer: Self-pay | Admitting: Family

## 2014-10-03 LAB — TSH: TSH: 1.19 u[IU]/mL (ref 0.35–4.50)

## 2014-10-05 DIAGNOSIS — B002 Herpesviral gingivostomatitis and pharyngotonsillitis: Secondary | ICD-10-CM | POA: Insufficient documentation

## 2014-10-05 NOTE — Assessment & Plan Note (Signed)
Continue actonel 

## 2014-10-05 NOTE — Assessment & Plan Note (Signed)
Lab Results  Component Value Date   TSH 1.19 10/02/2014   TSH stable on current dose of synthroid, continue same.

## 2014-10-05 NOTE — Assessment & Plan Note (Signed)
Continue prn valtex.

## 2014-10-05 NOTE — Assessment & Plan Note (Signed)
Stable on PPI, continue same.  

## 2014-10-08 NOTE — Addendum Note (Signed)
Addended by: [name] on: 10/08/2014 01:33 PM   Modules accepted: Level of Service

## 2014-10-15 ENCOUNTER — Telehealth: Payer: Self-pay | Admitting: Family

## 2014-10-15 ENCOUNTER — Encounter: Payer: Self-pay | Admitting: Family

## 2014-10-15 NOTE — Telephone Encounter (Signed)
Spoke with Dr. Nevada Crane, radiology. He told me that the MRI of the C-spine did not visualize the area of abnormality on the trachea which was noted on CT chest.

## 2014-10-16 ENCOUNTER — Telehealth: Payer: Self-pay | Admitting: Family

## 2014-10-16 NOTE — Telephone Encounter (Signed)
Pre Visit letter sent  °

## 2014-11-05 ENCOUNTER — Ambulatory Visit (INDEPENDENT_AMBULATORY_CARE_PROVIDER_SITE_OTHER): Payer: BLUE CROSS/BLUE SHIELD | Admitting: Family

## 2014-11-05 ENCOUNTER — Encounter: Payer: Self-pay | Admitting: Family

## 2014-11-05 VITALS — BP 100/70 | HR 67 | Temp 97.7°F | Resp 16 | Ht 66.0 in | Wt 131.0 lb

## 2014-11-05 DIAGNOSIS — Z0000 Encounter for general adult medical examination without abnormal findings: Secondary | ICD-10-CM

## 2014-11-05 LAB — BASIC METABOLIC PANEL
BUN: 9 mg/dL (ref 6–23)
CO2: 26 mEq/L (ref 19–32)
Calcium: 9.5 mg/dL (ref 8.4–10.5)
Chloride: 105 mEq/L (ref 96–112)
Creatinine, Ser: 0.72 mg/dL (ref 0.40–1.20)
GFR: 86.7 mL/min (ref >60.00)
Glucose, Bld: 89 mg/dL (ref 70–99)
Potassium: 3.9 mEq/L (ref 3.5–5.1)
Sodium: 138 mEq/L (ref 135–145)

## 2014-11-05 LAB — CBC WITH DIFFERENTIAL/PLATELET
Basophils Absolute: 0 10*3/uL (ref 0.0–0.1)
Basophils Relative: 0.6 % (ref 0.0–3.0)
Eosinophils Absolute: 0.1 10*3/uL (ref 0.0–0.7)
Eosinophils Relative: 1.7 % (ref 0.0–5.0)
HCT: 40.7 % (ref 36.0–46.0)
Hemoglobin: 13.8 g/dL (ref 12.0–15.0)
Lymphocytes Relative: 24.3 % (ref 12.0–46.0)
Lymphs Abs: 1.4 10*3/uL (ref 0.7–4.0)
MCHC: 33.8 g/dL (ref 30.0–36.0)
MCV: 97.8 fl (ref 78.0–100.0)
Monocytes Absolute: 0.6 10*3/uL (ref 0.1–1.0)
Monocytes Relative: 10.1 % (ref 3.0–12.0)
Neutro Abs: 3.6 10*3/uL (ref 1.4–7.7)
Neutrophils Relative %: 63.3 % (ref 43.0–77.0)
Platelets: 213 10*3/uL (ref 150.0–400.0)
RBC: 4.16 Mil/uL (ref 3.87–5.11)
RDW: 13.7 % (ref 11.5–15.5)
WBC: 5.6 10*3/uL (ref 4.0–10.5)

## 2014-11-05 LAB — LIPID PANEL
Cholesterol: 181 mg/dL (ref 0–200)
HDL: 89.2 mg/dL (ref >39.00)
LDL Cholesterol: 76 mg/dL (ref 0–99)
NonHDL: 91.8
Total CHOL/HDL Ratio: 2
Triglycerides: 81 mg/dL (ref 0.0–149.0)
VLDL: 16.2 mg/dL (ref 0.0–40.0)

## 2014-11-05 LAB — URINALYSIS, ROUTINE W REFLEX MICROSCOPIC
Bilirubin Urine: NEGATIVE
Ketones, ur: NEGATIVE
Leukocytes, UA: NEGATIVE
Nitrite: NEGATIVE
RBC / HPF: NONE SEEN (ref 0–?)
Specific Gravity, Urine: <=1.005 — AB (ref 1.000–1.030)
Total Protein, Urine: NEGATIVE
Urine Glucose: NEGATIVE
Urobilinogen, UA: 0.2 (ref 0.0–1.0)
WBC, UA: NONE SEEN (ref 0–?)
pH: 6 (ref 5.0–8.0)

## 2014-11-05 LAB — HEPATIC FUNCTION PANEL
ALT: 33 U/L (ref 0–35)
AST: 46 U/L — ABNORMAL HIGH (ref 0–37)
Albumin: 4.1 g/dL (ref 3.5–5.2)
Alkaline Phosphatase: 56 U/L (ref 39–117)
Bilirubin, Direct: 0.1 mg/dL (ref 0.0–0.3)
Total Bilirubin: 0.7 mg/dL (ref 0.2–1.2)
Total Protein: 6.6 g/dL (ref 6.0–8.3)

## 2014-11-05 MED ORDER — VALACYCLOVIR HCL 500 MG PO TABS: ORAL_TABLET | ORAL | Status: DC

## 2014-11-05 NOTE — Progress Notes (Addendum)
Subjective:    Patient ID: Dorothy [name], female    DOB: 1950-06-06, 64 y.o.   MRN: [id number]  HPI   Patient presents today for complete physical.  Immunizations:  Up to date Diet: healthy diet Exercise:  Plays tennis and bikes. walks Colonoscopy:  2013- reports that she had done with Dr. Collene Mares Dexa: 11/15 Pap Smear: 2015- normal Mammogram: 4/16 Dental: has apt tomorrow Eye exam- 6/8, healthy eyes   Review of Systems  Constitutional: Negative for unexpected weight change.  HENT: Negative for hearing loss and rhinorrhea.   Eyes: Negative for visual disturbance.  Respiratory: Negative for cough and shortness of breath.   Cardiovascular: Negative for chest pain.  Gastrointestinal:       Chronic constipation- stable with probiotic and colace  Genitourinary: Negative for dysuria, frequency and vaginal bleeding.  Musculoskeletal: Negative for arthralgias.       + soreness left anterior thigh  Skin: Negative for rash.  Neurological: Negative for headaches.  Hematological: Negative for adenopathy.  Psychiatric/Behavioral:       Denies depression/anxiety   Past Medical History  Diagnosis Date  . Thyroid disease   . Hyperlipidemia   . PONV (postoperative nausea and vomiting)   . Hypothyroidism   . Arthritis   . Osteopenia   . 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 Bruises easily     more on hands and arms  . Left wrist fracture 12/10/2013  . Osteoporosis   . Allergy     seasonal  . GERD (gastroesophageal reflux disease)   . History of chicken pox     History   Social History  . Marital Status: Married    Spouse Name: N/A  . Number of Children: N/A  . Years of Education: N/A   Occupational History  . Not on file.   Social History Main Topics  . Smoking status: Former Smoker -- 20 years    Types: Cigarettes    Quit date: 11/11/2011  . Smokeless tobacco: Never Used  . Alcohol Use: 3.0 - 4.2 oz/week    5-7 Glasses of wine  per week     Comment: occassional  . Drug Use: No  . Sexual Activity:    Partners: Male   Other Topics Concern  . 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 Ganglion cyst excision Right 2000  . Dental surgery  1993  . 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 Total hip arthroplasty Left 07/17/2012    Procedure: TOTAL HIP ARTHROPLASTY;  Surgeon: Kerin Salen, MD;  Location: [place];  Service: Orthopedics;  Laterality: Left;    Family History  Problem Relation Age of Onset  . Parkinson's disease Father   . Alcohol abuse Father   . Arthritis Maternal Grandmother     Allergies  Allergen Reactions  . Fosamax [Alendronate Sodium]     Poor dentition  , bone implant  . Latex Rash  . Penicillins Rash    Current Outpatient Prescriptions on File Prior to Visit  Medication Sig Dispense Refill  . aspirin EC 81 MG tablet Take 81 mg by mouth daily.    . Calcium Carbonate-Vitamin D (CALCIUM 600+D) 600-400 MG-UNIT  per tablet Take 2 tablets by mouth daily.    . Cholecalciferol (VITAMIN D) 2000 UNITS CAPS Take 1 capsule by mouth daily.    Marland Kitchen docusate sodium (COLACE) 100 MG capsule Take 100 mg by mouth every 6 (six) hours as needed for constipation.    Marland Kitchen levothyroxine (SYNTHROID, LEVOTHROID) 100 MCG tablet Take 1 tablet (100 mcg total) by mouth daily. 90 tablet 3  . pantoprazole (PROTONIX) 40 MG tablet TAKE 1 BY MOUTH DAILY 90 tablet 1  . risedronate (ACTONEL) 35 MG tablet Take 35 mg by mouth once a week.  0  . saccharomyces boulardii (FLORASTOR) 250 MG capsule Take 250 mg by mouth daily.    . simvastatin (ZOCOR) 10 MG tablet Take one every evening 90 tablet 1  . valACYclovir (VALTREX) 500 MG tablet TAKE 1 BY MOUTH TWICE DAILY FOR THREE DAYS FOR COLD SORE 15 tablet 0     No current facility-administered medications on file prior to visit.    BP 100/70 mmHg  Pulse 67  Temp(Src) 97.7 F (36.5 C) (Oral)  Resp 16  Ht 5\' 6"  (1.676 m)  Wt 131 lb (59.421 kg)  BMI 21.15 kg/m2  SpO2 98%        Objective:   Physical Exam  Physical Exam  Constitutional: She is oriented to person, place, and time. She appears well-developed and well-nourished. No distress.  HENT:  Head: Normocephalic and atraumatic.  Right Ear: Tympanic membrane and ear canal normal.  Left Ear: Tympanic membrane and ear canal normal.  Mouth/Throat: Oropharynx is clear and moist.  Eyes: Pupils are equal, round, and reactive to light. No scleral icterus.  Neck: Normal range of motion. No thyromegaly present.  Cardiovascular: Normal rate and regular rhythm.   No murmur heard. Pulmonary/Chest: Effort normal and breath sounds normal. No respiratory distress. He has no wheezes. She has no rales. She exhibits no tenderness.  Abdominal: Soft. Bowel sounds are normal. He exhibits no distension and no mass. There is no tenderness. There is no rebound and no guarding.  Musculoskeletal: She exhibits no edema.  Lymphadenopathy:    She has no cervical adenopathy.  Neurological: She is alert and oriented to person, place, and time. She has normal patellar reflexes. She exhibits normal muscle tone. Coordination normal.  Skin: Skin is warm and dry.  Psychiatric: She has a normal mood and affect. Her behavior is normal. Judgment and thought content normal.  Breasts: Examined lying Right: Without masses, retractions, discharge or axillary adenopathy.  Left: Without masses, retractions, discharge or axillary adenopathy.  Pelvic:  Deferred.        Assessment & Plan:         Assessment & Plan:  EKG is performed today and personally reviewed. It reveals NSR without ischemic changes.

## 2014-11-05 NOTE — Progress Notes (Signed)
Pre visit review using our clinic review tool, if applicable. No additional management support is needed unless otherwise documented below in the visit note. 

## 2014-11-05 NOTE — Patient Instructions (Signed)
Please complete lab work prior to leaving. Follow up 6 months.

## 2014-11-05 NOTE — Addendum Note (Signed)
Addended by: [name] on: 11/05/2014 03:17 PM   Modules accepted: Miquel Dunn

## 2014-11-05 NOTE — Assessment & Plan Note (Addendum)
Continue healthy diet, exercise, obtain routine lab work. Mammo, colo, dexa and pap up to date.

## 2014-11-10 ENCOUNTER — Encounter: Payer: Self-pay | Admitting: Family

## 2015-01-09 ENCOUNTER — Telehealth: Payer: Self-pay | Admitting: Family

## 2015-01-09 DIAGNOSIS — R911 Solitary pulmonary nodule: Secondary | ICD-10-CM

## 2015-01-09 NOTE — Telephone Encounter (Signed)
Caller name: Dorothy [name] Relation to pt: self Call back number: [phone] or [phone] pt states can leave message on phone. Pharmacy:  Reason for call: Pt called stating that [name] mentioned to her to call in this month for her to put in an order for a specialized chest x-ray to have done in October for this year, pt states would like to have it set up for October 25 or 26 after lunch if possible. Please advise.

## 2015-01-10 NOTE — Telephone Encounter (Signed)
Melissa please advise if a Cxr is appropriate for this patient.     KP

## 2015-01-22 ENCOUNTER — Ambulatory Visit: Payer: BLUE CROSS/BLUE SHIELD | Admitting: Neurology

## 2015-01-27 ENCOUNTER — Telehealth: Payer: Self-pay | Admitting: Family

## 2015-01-27 DIAGNOSIS — Z87891 Personal history of nicotine dependence: Secondary | ICD-10-CM

## 2015-01-27 NOTE — Telephone Encounter (Signed)
Pt would like to have CT of chest & trachea done at [place] said it's closer to her and wants all records in once place.

## 2015-01-29 ENCOUNTER — Ambulatory Visit (INDEPENDENT_AMBULATORY_CARE_PROVIDER_SITE_OTHER): Payer: BLUE CROSS/BLUE SHIELD | Admitting: Neurology

## 2015-01-29 ENCOUNTER — Encounter: Payer: Self-pay | Admitting: Neurology

## 2015-01-29 VITALS — BP 90/64 | HR 93 | Wt 129.4 lb

## 2015-01-29 DIAGNOSIS — R202 Paresthesia of skin: Secondary | ICD-10-CM | POA: Diagnosis not present

## 2015-01-29 DIAGNOSIS — R200 Anesthesia of skin: Secondary | ICD-10-CM

## 2015-01-29 DIAGNOSIS — R930 Abnormal findings on diagnostic imaging of skull and head, not elsewhere classified: Secondary | ICD-10-CM

## 2015-01-29 DIAGNOSIS — R9082 White matter disease, unspecified: Secondary | ICD-10-CM

## 2015-01-29 NOTE — Progress Notes (Signed)
NEUROLOGY FOLLOW UP OFFICE NOTE  ABY GESSEL [id number]  HISTORY OF PRESENT ILLNESS: Dorothy [name] is a 64 year old right-handed woman with hypothyroidism, hyperlipidemia, osteopenia, and arthritis who follows up for right facial numbness.  Labs reviewed.  UPDATE: Labs from February include B12 383, ceruloplasmin 26 and copper 102.   She has been doing well.  She has not had further spells of unilateral numbness.  However, on Saturday, she found a tick in her pubic hair.  She took it off right away and it was not imbedded in her skin.  There has been no rash and she has not developed any symptoms.  She kept the tick and brought it in a plastic bag.  It is still alive.  She is wondering if anything needs to be done.  HISTORY: In October, she developed a sharp head pain in the right parietal region, followed by numbness and tingling of the right side of her face.  It was brief.  She had an MRI of the brain with and without contrast performed on 03/13/14, which showed moderately extensive non-enhancing predominantly periventricular white matter lesions.  Contrast was not used in this study.  She had a similar episode of right facial and hemi-body numbness in August 2011, lasting 2 hours.  She had two other episodes later that year, lasting between 20 to 35 minutes.  On 04/23/10, she had an MRI of the cervical spine with and without contrast performed, which revealed focus of increased T2 signal in the dorsal columns of the cervical cord, extending from mid C2 through the C3-4 level, which was also seen on an MRI of the cervical spine performed in 2005.  She was evaluated by Dr. Leta Baptist at [facility].  MRI of the brain with and without contrast was performed on 07/08/10, and reportedly revealed mild to moderate periventricular gliosis around the periatrial and occipital horn regions with few scattered subcortical foci.    MRI of the thoracic spine with and without was  normal.   She underwent an LP on 08/27/10.  CSF showed WBC 1, protein 50, glucose 68, IgG 3, no OCBs, Lyme negative, ACE 6, cytology negative, negative gram stain and culture, negative viral and fungal cultures.  Blood work, including ANA, ANCA, RPR, ACE, HIV, and hepatitis panel were reportedly negative.  It was decided to monitor and repeat imaging in one year. She was found to have B12 deficiency with prior level from 11/24/11 of 252.  She had an MRI of the cervical spine with and without contrast which showed non-enhancing hyperintensity in the cervical dorsal column, most pronounced at C1-C4.  It appears stable when compared to prior imaging from 2011.  She says she was previously treated.  Except for the most recent event, they have never been associated with headache.  She denies history of headache or seizures.  She has no family history of MS.  Her mother had headaches related to TMJ dysfunction.  Her sister had an isolated seizure as a small child due to PCN allergy.  She reports that she fell off a horse and hit her head about 20 years ago.  For the next 8 months, she noted sharp electric pain running down her spine whenever she would flex her neck.  This has since resolved.  She also had been experiencing pins and needles sensation involving the left hand, which wakes her up at night.  She attributes this to sleeping on her left side, so she turns over and  it resolves.  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  It can involve the second to fourth fingers or only the fifth finger.  There is no associated weakness or neck pain.  PAST MEDICAL HISTORY: Past Medical History  Diagnosis Date  . Thyroid disease   . Hyperlipidemia   . PONV (postoperative nausea and vomiting)   . Hypothyroidism   . Arthritis   . Osteopenia   . 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 Bruises easily     more on hands and arms  . Left wrist  fracture 12/10/2013  . Osteoporosis   . Allergy     seasonal  . GERD (gastroesophageal reflux disease)   . History of chicken pox     MEDICATIONS: Current Outpatient Prescriptions on File Prior to Visit  Medication Sig Dispense Refill  . aspirin EC 81 MG tablet Take 81 mg by mouth daily.    . Calcium Carbonate-Vitamin D (CALCIUM 600+D) 600-400 MG-UNIT per tablet Take 2 tablets by mouth daily.    . Cholecalciferol (VITAMIN D) 2000 UNITS CAPS Take 1 capsule by mouth daily.    Marland Kitchen docusate sodium (COLACE) 100 MG capsule Take 100 mg by mouth every 6 (six) hours as needed for constipation.    Marland Kitchen levothyroxine (SYNTHROID, LEVOTHROID) 100 MCG tablet Take 1 tablet (100 mcg total) by mouth daily. 90 tablet 3  . pantoprazole (PROTONIX) 40 MG tablet TAKE 1 BY MOUTH DAILY 90 tablet 1  . risedronate (ACTONEL) 35 MG tablet Take 35 mg by mouth once a week.  0  . saccharomyces boulardii (FLORASTOR) 250 MG capsule Take 250 mg by mouth daily.    . simvastatin (ZOCOR) 10 MG tablet Take one every evening 90 tablet 1  . valACYclovir (VALTREX) 500 MG tablet TAKE 1 BY MOUTH TWICE DAILY FOR THREE DAYS FOR COLD SORE 90 tablet 0   No current facility-administered medications on file prior to visit.    ALLERGIES: Allergies  Allergen Reactions  . Fosamax [Alendronate Sodium]     Poor dentition  , bone implant  . Latex Rash  . Penicillins Rash    FAMILY HISTORY: Family History  Problem Relation Age of Onset  . Parkinson's disease Father   . Alcohol abuse Father   . Arthritis Maternal Grandmother     SOCIAL HISTORY: Social History   Social History  . Marital Status: Married    Spouse Name: N/A  . Number of Children: N/A  . Years of Education: N/A   Occupational History  . Not on file.   Social History Main Topics  . Smoking status: Former Smoker -- 20 years    Types: Cigarettes    Quit date: 11/11/2011  . Smokeless tobacco: Never Used  . Alcohol Use: 3.0 - 4.2 oz/week    5-7 Glasses of  wine per week     Comment: occassional  . Drug Use: No  . Sexual Activity:    Partners: Male   Other Topics Concern  . 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)    REVIEW OF SYSTEMS: Constitutional: No fevers, chills, or sweats, no generalized fatigue, change in appetite Eyes: No visual changes, double vision, eye pain Ear, nose and throat: No hearing loss, ear  pain, nasal congestion, sore throat Cardiovascular: No chest pain, palpitations Respiratory:  No shortness of breath at rest or with exertion, wheezes GastrointestinaI: No nausea, vomiting, diarrhea, abdominal pain, fecal incontinence Genitourinary:  No dysuria, urinary retention or frequency Musculoskeletal:  No neck pain, back pain Integumentary: No rash, pruritus, skin lesions Neurological: as above Psychiatric: No depression, insomnia, anxiety Endocrine: No palpitations, fatigue, diaphoresis, mood swings, change in appetite, change in weight, increased thirst Hematologic/Lymphatic:  No anemia, purpura, petechiae. Allergic/Immunologic: no itchy/runny eyes, nasal congestion, recent allergic reactions, rashes  PHYSICAL EXAM: Filed Vitals:   01/29/15 1343  BP: 90/64  Pulse: 93   General: No acute distress.  Patient appears well-groomed.  Head:  Normocephalic/atraumatic Eyes:  Fundoscopic exam unremarkable without vessel changes, exudates, hemorrhages or papilledema. Neck: supple, no paraspinal tenderness, full range of motion Heart:  Regular rate and rhythm Lungs:  Clear to auscultation bilaterally Back: No paraspinal tenderness Neurological Exam: alert and oriented to person, place, and time, recent and remote memory intact, fund of knowledge intact, attention and concentration intact, speech fluent and not dysarthric, language intact.  CN II-XII intact.   Bulk and tone normal.  Muscle strength 5/5 throughout.  Sensation to light touch intact.  Deep tendon reflexes 3+ throughout.  Mild postural and kinetic tremor on finger to nose testing.  Normal station and stride.   IMPRESSION: Recurrent episodes of right sided numbness with abnormal MRI of brain and cervical spine.  Unclear etiology  PLAN: 1.  She is stable.  Extensive workup has been unremarkable.  I would like to send her to [facility] for an evaluation to make sure we are not missing anything. 2.  As far as the tick, since it was not engorged in her skin and she has yet to develop any symptoms, I don't think there is anything else to do, however I told her to check with her PCP just to make sure. 3.  Follow up in 6 months.  15 minutes spent face to face with patient, over 50% spent discussing management and tick.  Metta Clines, DO  CC:  [name], NP

## 2015-01-29 NOTE — Addendum Note (Signed)
Addended by: Gerald Leitz A on: 01/29/2015 03:34 PM   Modules accepted: Orders

## 2015-01-29 NOTE — Patient Instructions (Signed)
1.  Follow up in 6 months 2.  Refer to [facility] at [facility] for their opinion

## 2015-02-04 ENCOUNTER — Telehealth: Payer: Self-pay | Admitting: Family

## 2015-02-04 DIAGNOSIS — Z720 Tobacco use: Secondary | ICD-10-CM

## 2015-02-04 NOTE — Telephone Encounter (Signed)
Caller name: Baker Janus with [facility] Can be reached: [phone]  Reason for call: Needing order for CT CHEST LUNG CANCER SCREENING LOW DOSE W/O CM to be updated. 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 She said to please remove just that wording and it is fine. Please call and notify if questions or when updated so they can schedule pt.

## 2015-02-04 NOTE — Addendum Note (Signed)
Addended by: Kelle Darting A on: 02/04/2015 03:33 PM   Modules accepted: Orders

## 2015-02-04 NOTE — Telephone Encounter (Signed)
Notified Gail at below #. Referral updated.

## 2015-02-04 NOTE — Telephone Encounter (Signed)
Done

## 2015-04-02 ENCOUNTER — Ambulatory Visit
Admission: RE | Admit: 2015-04-02 | Discharge: 2015-04-02 | Disposition: A | Discharge status: Home / Self Care | Payer: BLUE CROSS/BLUE SHIELD | Source: Ambulatory Visit | Attending: Family | Admitting: Family

## 2015-04-02 DIAGNOSIS — Z720 Tobacco use: Secondary | ICD-10-CM

## 2015-04-04 ENCOUNTER — Telehealth: Payer: Self-pay | Admitting: *Deleted

## 2015-04-04 NOTE — Telephone Encounter (Signed)
Pt states Dr Coralyn Mark had given her a prescription in October last year for a prevnar 13 injection but pt never took it. She states she will be in [place], [place] for the next 2 months and is requesting [place] send an Rx to [facility] in [place] and they will give it to her there. Please advise?

## 2015-04-13 NOTE — Telephone Encounter (Signed)
Ok to sent rx please.

## 2015-04-14 MED ORDER — PNEUMOCOCCAL 13-VAL CONJ VACC IM SUSP: 0.5000 mL | Freq: Once | INTRAMUSCULAR | Status: DC

## 2015-04-14 NOTE — Telephone Encounter (Signed)
Rx sent, notified pt. 

## 2015-04-18 ENCOUNTER — Telehealth: Payer: Self-pay | Admitting: *Deleted

## 2015-04-18 DIAGNOSIS — Z760 Encounter for issue of repeat prescription: Secondary | ICD-10-CM

## 2015-04-18 MED ORDER — PANTOPRAZOLE SODIUM 40 MG PO TBEC: DELAYED_RELEASE_TABLET | ORAL | Status: DC

## 2015-04-18 MED ORDER — SIMVASTATIN 10 MG PO TABS: ORAL_TABLET | ORAL | Status: DC

## 2015-04-18 NOTE — Telephone Encounter (Signed)
Received fax from [facility] requesting refills of simvastatin and pantoprazole. Refills sent.

## 2015-07-29 ENCOUNTER — Other Ambulatory Visit (INDEPENDENT_AMBULATORY_CARE_PROVIDER_SITE_OTHER): Payer: BLUE CROSS/BLUE SHIELD

## 2015-07-29 ENCOUNTER — Ambulatory Visit (INDEPENDENT_AMBULATORY_CARE_PROVIDER_SITE_OTHER): Payer: BLUE CROSS/BLUE SHIELD | Admitting: Neurology

## 2015-07-29 ENCOUNTER — Encounter: Payer: Self-pay | Admitting: Neurology

## 2015-07-29 VITALS — BP 110/62 | HR 56 | Ht 66.0 in | Wt 130.8 lb

## 2015-07-29 DIAGNOSIS — G373 Acute transverse myelitis in demyelinating disease of central nervous system: Secondary | ICD-10-CM | POA: Diagnosis not present

## 2015-07-29 LAB — VITAMIN B12: Vitamin B-12: 296 pg/mL (ref 211–911)

## 2015-07-29 NOTE — Progress Notes (Signed)
NEUROLOGY FOLLOW UP OFFICE NOTE  AMDREA SAKER [id number]  HISTORY OF PRESENT ILLNESS: Dorothy [name] is a 65 year old right-handed woman with hypothyroidism, hyperlipidemia, osteopenia, and arthritis who follows up for right facial numbness.  Recent consult from [facility] reviewed.  UPDATE: She was referred to the [facility] at [facility] for second opinion.  She was evaluated by Dr. George Hugh last month.  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  The event in 2011 was likely a transient symptoms from the cervical myelopathy.  A demyelinating disease is not suspected.  HISTORY:  She reports that she fell off a horse and hit her head in 1996-97.  For the next 8 months, she noted sharp electric pain running down her spine whenever she would flex her neck.  This has since resolved.  She had an episode of right facial and hemi-body numbness in August 2011, lasting 2 hours.  This occurred after walking her dog down a hill.  She had two other episodes later that year, lasting between 20 to 35 minutes.  On 04/23/10, she had an MRI of the cervical spine with and without contrast performed, which revealed focus of increased T2 signal in the dorsal columns of the cervical cord, extending from mid C2 through the C3-4 level, which was also seen on an MRI of the cervical spine performed in 2005.  She was evaluated by Dr. Leta Baptist at [facility].  MRI of the brain with and without contrast was performed on 07/08/10, and reportedly revealed mild to moderate periventricular gliosis around the periatrial and occipital horn regions with few scattered subcortical foci.    MRI of the thoracic spine with and without was normal.   She underwent an LP on 08/27/10.  CSF showed WBC 1, protein 50, glucose 68, IgG 3, no OCBs, Lyme negative, ACE 6, cytology negative, negative gram stain and culture,  negative viral and fungal cultures.  Blood work, including ANA, ANCA, RPR, ACE, HIV, and hepatitis panel were reportedly negative.  It was decided to monitor and repeat imaging in one year. B12 from February 2016 was 383.  Ceruloplasmin was 26 and copper was 102.  She had an MRI of the cervical spine with and without contrast which showed non-enhancing hyperintensity in the cervical dorsal column, most pronounced at C1-C4.  It appears stable when compared to prior imaging from 2011.  She says she was previously treated.    In October, she developed a sharp head pain in the right parietal region, followed by numbness and tingling of the right side of her face, similar to prior event (sans headache).  It was brief.  She had an MRI of the brain with and without contrast performed on 03/13/14, which showed moderately extensive non-enhancing predominantly periventricular white matter lesions.  Contrast was not used in this study.  She also had been experiencing pins and needles sensation involving the left hand, which wakes her up at night.  She attributes this to sleeping on her left side, so she turns over and it resolves.  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  It can involve the second to fourth fingers or only the fifth finger.  There is no associated weakness or neck pain.  She denies history of headache or seizures.  She denies history of optic neuritis.  She has no family history of MS.  Her mother had headaches related to TMJ dysfunction.  Her sister had an isolated seizure as a small child due to PCN allergy.  PAST MEDICAL HISTORY: Past Medical History  Diagnosis Date  . Thyroid disease   . Hyperlipidemia   . PONV (postoperative nausea and vomiting)   . Hypothyroidism   . Arthritis   . Osteopenia   . 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 Bruises easily     more on hands and arms  . Left wrist fracture 12/10/2013    . Osteoporosis   . Allergy     seasonal  . GERD (gastroesophageal reflux disease)   . History of chicken pox     MEDICATIONS: Current Outpatient Prescriptions on File Prior to Visit  Medication Sig Dispense Refill  . aspirin EC 81 MG tablet Take 81 mg by mouth daily.    . Calcium Carbonate-Vitamin D (CALCIUM 600+D) 600-400 MG-UNIT per tablet Take 2 tablets by mouth daily.    . Cholecalciferol (VITAMIN D) 2000 UNITS CAPS Take 1 capsule by mouth daily.    Marland Kitchen docusate sodium (COLACE) 100 MG capsule Take 100 mg by mouth every 6 (six) hours as needed for constipation.    Marland Kitchen levothyroxine (SYNTHROID, LEVOTHROID) 100 MCG tablet Take 1 tablet (100 mcg total) by mouth daily. 90 tablet 3  . pantoprazole (PROTONIX) 40 MG tablet TAKE 1 BY MOUTH DAILY 90 tablet 1  . risedronate (ACTONEL) 35 MG tablet Take 35 mg by mouth once a week.  0  . saccharomyces boulardii (FLORASTOR) 250 MG capsule Take 250 mg by mouth daily.    . simvastatin (ZOCOR) 10 MG tablet Take one every evening 90 tablet 1  . valACYclovir (VALTREX) 500 MG tablet TAKE 1 BY MOUTH TWICE DAILY FOR THREE DAYS FOR COLD SORE 90 tablet 0   No current facility-administered medications on file prior to visit.    ALLERGIES: Allergies  Allergen Reactions  . Fosamax [Alendronate Sodium]     Poor dentition  , bone implant  . Latex Rash  . Penicillins Rash    FAMILY HISTORY: Family History  Problem Relation Age of Onset  . Parkinson's disease Father   . Alcohol abuse Father   . Arthritis Maternal Grandmother     SOCIAL HISTORY: Social History   Social History  . Marital Status: Married    Spouse Name: N/A  . Number of Children: N/A  . Years of Education: N/A   Occupational History  . Not on file.   Social History Main Topics  . Smoking status: Former Smoker -- 20 years    Types: Cigarettes    Quit date: 11/11/2011  . Smokeless tobacco: Never Used  . Alcohol Use: 3.0 - 4.2 oz/week    5-7 Glasses of wine per week      Comment: occassional  . Drug Use: No  . Sexual Activity:    Partners: Male   Other Topics Concern  . 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)    REVIEW OF SYSTEMS: Constitutional: No fevers, chills, or sweats, no generalized fatigue, change in appetite Eyes: No visual changes, double vision, eye pain Ear, nose and throat: No hearing loss, ear pain, nasal congestion, sore throat Cardiovascular: No chest pain, palpitations Respiratory:  No shortness of breath at  rest or with exertion, wheezes GastrointestinaI: No nausea, vomiting, diarrhea, abdominal pain, fecal incontinence Genitourinary:  No dysuria, urinary retention or frequency Musculoskeletal:  No neck pain, back pain Integumentary: No rash, pruritus, skin lesions Neurological: as above Psychiatric: No depression, insomnia, anxiety Endocrine: No palpitations, fatigue, diaphoresis, mood swings, change in appetite, change in weight, increased thirst Hematologic/Lymphatic:  No anemia, purpura, petechiae. Allergic/Immunologic: no itchy/runny eyes, nasal congestion, recent allergic reactions, rashes  PHYSICAL EXAM: Filed Vitals:   07/29/15 1401  BP: 110/62  Pulse: 56   General: No acute distress.  Patient appears well-groomed.  Head:  Normocephalic/atraumatic Eyes:  Fundi examined but not visualized Neck: supple, no paraspinal tenderness, full range of motion Heart:  Regular rate and rhythm Lungs:  Clear to auscultation bilaterally Back: No paraspinal tenderness Neurological Exam: alert and oriented to person, place, and time, recent and remote memory intact, fund of knowledge intact, attention and concentration intact, speech fluent and not dysarthric, language intact.  CN II-XII intact.  Bulk and tone normal.  Muscle strength 5/5 throughout.  Decreased vibration  sensation in feet.  Sensation to temperature intact.  Deep tendon reflexes 3+ throughout.  Mild postural and kinetic tremor on finger to nose testing.  Normal station and stride.   IMPRESSION: Recurrent episodes of right sided numbness with abnormal MRI of brain and cervical spine.  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  PLAN: Check B12 level to determine if supplementation is required Follow up in 6 months.  15 minutes spent face to face with patient, over 50% spent discussing diagnosis and management.  Metta Clines, DO  CC:  [name], NP

## 2015-07-29 NOTE — Addendum Note (Signed)
Addended by: Gerda Diss A on: 07/29/2015 02:27 PM   Modules accepted: Orders

## 2015-07-29 NOTE — Patient Instructions (Signed)
Check B12 level Follow up in 6 months.

## 2015-07-31 ENCOUNTER — Telehealth: Payer: Self-pay

## 2015-07-31 NOTE — Telephone Encounter (Signed)
Left detailed message on machine.

## 2015-07-31 NOTE — Telephone Encounter (Signed)
-----   Message from Pieter Partridge, DO sent at 07/31/2015  7:05 AM EDT ----- b12 is 296 which is normal but low-normal range.  I would start b12 1024mcg pill daily and recheck level in 6 months prior to follow up.

## 2015-09-03 ENCOUNTER — Other Ambulatory Visit: Payer: Self-pay

## 2015-09-03 DIAGNOSIS — Z1231 Encounter for screening mammogram for malignant neoplasm of breast: Secondary | ICD-10-CM

## 2015-10-23 ENCOUNTER — Ambulatory Visit
Admission: RE | Admit: 2015-10-23 | Discharge: 2015-10-23 | Disposition: A | Discharge status: Home / Self Care | Payer: BLUE CROSS/BLUE SHIELD | Source: Ambulatory Visit

## 2015-10-23 DIAGNOSIS — Z1231 Encounter for screening mammogram for malignant neoplasm of breast: Secondary | ICD-10-CM

## 2015-10-23 LAB — HM MAMMOGRAPHY: HM Mammogram: NORMAL (ref 0–4)

## 2015-11-12 ENCOUNTER — Ambulatory Visit (INDEPENDENT_AMBULATORY_CARE_PROVIDER_SITE_OTHER): Payer: BLUE CROSS/BLUE SHIELD | Admitting: Family

## 2015-11-12 ENCOUNTER — Encounter: Payer: Self-pay | Admitting: Family

## 2015-11-12 VITALS — BP 106/70 | HR 74 | Temp 97.8°F | Resp 16 | Ht 66.0 in | Wt 133.4 lb

## 2015-11-12 DIAGNOSIS — Z760 Encounter for issue of repeat prescription: Secondary | ICD-10-CM | POA: Diagnosis not present

## 2015-11-12 DIAGNOSIS — Z0001 Encounter for general adult medical examination with abnormal findings: Secondary | ICD-10-CM | POA: Diagnosis not present

## 2015-11-12 DIAGNOSIS — Z0000 Encounter for general adult medical examination without abnormal findings: Secondary | ICD-10-CM

## 2015-11-12 DIAGNOSIS — R9431 Abnormal electrocardiogram [ECG] [EKG]: Secondary | ICD-10-CM

## 2015-11-12 LAB — LIPID PANEL
Cholesterol: 226 mg/dL — ABNORMAL HIGH (ref 0–200)
HDL: 89.6 mg/dL (ref >39.00)
LDL Cholesterol: 116 mg/dL — ABNORMAL HIGH (ref 0–99)
NonHDL: 136.26
Total CHOL/HDL Ratio: 3
Triglycerides: 101 mg/dL (ref 0.0–149.0)
VLDL: 20.2 mg/dL (ref 0.0–40.0)

## 2015-11-12 LAB — BASIC METABOLIC PANEL
BUN: 13 mg/dL (ref 6–23)
CO2: 27 mEq/L (ref 19–32)
Calcium: 9.1 mg/dL (ref 8.4–10.5)
Chloride: 104 mEq/L (ref 96–112)
Creatinine, Ser: 0.68 mg/dL (ref 0.40–1.20)
GFR: 92.31 mL/min (ref >60.00)
Glucose, Bld: 94 mg/dL (ref 70–99)
Potassium: 3.7 mEq/L (ref 3.5–5.1)
Sodium: 139 mEq/L (ref 135–145)

## 2015-11-12 LAB — CBC WITH DIFFERENTIAL/PLATELET
Basophils Absolute: 0 10*3/uL (ref 0.0–0.1)
Basophils Relative: 0.5 % (ref 0.0–3.0)
Eosinophils Absolute: 0.1 10*3/uL (ref 0.0–0.7)
Eosinophils Relative: 1.7 % (ref 0.0–5.0)
HCT: 39.1 % (ref 36.0–46.0)
Hemoglobin: 13.2 g/dL (ref 12.0–15.0)
Lymphocytes Relative: 25 % (ref 12.0–46.0)
Lymphs Abs: 1.4 10*3/uL (ref 0.7–4.0)
MCHC: 33.8 g/dL (ref 30.0–36.0)
MCV: 98 fl (ref 78.0–100.0)
Monocytes Absolute: 0.5 10*3/uL (ref 0.1–1.0)
Monocytes Relative: 9.4 % (ref 3.0–12.0)
Neutro Abs: 3.7 10*3/uL (ref 1.4–7.7)
Neutrophils Relative %: 63.4 % (ref 43.0–77.0)
Platelets: 227 10*3/uL (ref 150.0–400.0)
RBC: 3.99 Mil/uL (ref 3.87–5.11)
RDW: 13.7 % (ref 11.5–15.5)
WBC: 5.8 10*3/uL (ref 4.0–10.5)

## 2015-11-12 LAB — URINALYSIS, ROUTINE W REFLEX MICROSCOPIC
Bilirubin Urine: NEGATIVE
Leukocytes, UA: NEGATIVE
Nitrite: NEGATIVE
RBC / HPF: NONE SEEN (ref 0–?)
Specific Gravity, Urine: 1.01 (ref 1.000–1.030)
Total Protein, Urine: NEGATIVE
Urine Glucose: NEGATIVE
Urobilinogen, UA: 0.2 (ref 0.0–1.0)
WBC, UA: NONE SEEN (ref 0–?)
pH: 7 (ref 5.0–8.0)

## 2015-11-12 LAB — TSH: TSH: 1.14 u[IU]/mL (ref 0.35–4.50)

## 2015-11-12 LAB — HEPATIC FUNCTION PANEL
ALT: 26 U/L (ref 0–35)
AST: 34 U/L (ref 0–37)
Albumin: 4.2 g/dL (ref 3.5–5.2)
Alkaline Phosphatase: 55 U/L (ref 39–117)
Bilirubin, Direct: 0.1 mg/dL (ref 0.0–0.3)
Total Bilirubin: 0.6 mg/dL (ref 0.2–1.2)
Total Protein: 6.7 g/dL (ref 6.0–8.3)

## 2015-11-12 LAB — HEPATITIS C ANTIBODY: HCV Ab: NEGATIVE

## 2015-11-12 MED ORDER — LEVOTHYROXINE SODIUM 100 MCG PO TABS: 100.0000 ug | ORAL_TABLET | Freq: Every day | ORAL | Status: DC

## 2015-11-12 MED ORDER — SIMVASTATIN 10 MG PO TABS: ORAL_TABLET | ORAL | Status: DC

## 2015-11-12 MED ORDER — RISEDRONATE SODIUM 35 MG PO TABS: 35.0000 mg | ORAL_TABLET | ORAL | Status: DC

## 2015-11-12 MED ORDER — PANTOPRAZOLE SODIUM 40 MG PO TBEC: DELAYED_RELEASE_TABLET | ORAL | Status: DC

## 2015-11-12 MED ORDER — VALACYCLOVIR HCL 500 MG PO TABS: ORAL_TABLET | ORAL | Status: DC

## 2015-11-12 MED FILL — SIMVASTATIN 10 MG TABLET: 10 | 14 days supply | Qty: 14 | Fill #0

## 2015-11-12 NOTE — Patient Instructions (Addendum)
Please complete your lab work prior to leaving. You will be contacted about your referral to Dr. Stanford Breed (cardiology). Follow up in 6 months for your welcome to medicare physical.

## 2015-11-12 NOTE — Progress Notes (Signed)
Pre visit review using our clinic review tool, if applicable. No additional management support is needed unless otherwise documented below in the visit note. 

## 2015-11-12 NOTE — Progress Notes (Signed)
Subjective:    Patient ID: Dorothy [name], female    DOB: 01/04/1951, 65 y.o.   MRN: [id number]  HPI  Patient presents today for complete physical. She reports that she and her husband recently purchase some land with 2 ponds and a covered bridge. They are excited about this.   Immunizations: will need prevnar next year (she thinks she had it at [facility] will try to obtain records) Diet: healthy Exercise: plays tennis, garden, walks dog Colonoscopy: 3/13 Dexa: 11/15 Pap Smear: 6/15 Mammogram: 10/23/15  Review of Systems  Constitutional: Negative for unexpected weight change.  HENT: Negative for rhinorrhea.   Respiratory: Negative for cough.   Cardiovascular: Negative for leg swelling.  Gastrointestinal: Negative for diarrhea.       Has chronic constipation, uses colace and probiotic which helps  Genitourinary: Negative for dysuria and frequency.  Neurological: Negative for headaches.  Hematological: Negative for adenopathy.  Psychiatric/Behavioral:       Denies depression/anxiety   Past Medical History  Diagnosis Date  . Thyroid disease   . Hyperlipidemia   . PONV (postoperative nausea and vomiting)   . Hypothyroidism   . Arthritis   . Osteopenia   . 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 Bruises easily     more on hands and arms  . Left wrist fracture 12/10/2013  . Osteoporosis   . Allergy     seasonal  . GERD (gastroesophageal reflux disease)   . History of chicken pox      Social History   Social History  . Marital Status: Married    Spouse Name: N/A  . Number of Children: N/A  . Years of Education: N/A   Occupational History  . Not on file.   Social History Main Topics  . Smoking status: Former Smoker -- 20 years    Types: Cigarettes    Quit date: 11/11/2011  . Smokeless tobacco: Never Used  . Alcohol Use: 3.0 - 4.2 oz/week    5-7 Glasses of wine per week     Comment: occassional  . Drug Use: No  . Sexual  Activity:    Partners: Male   Other Topics Concern  . 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 Ganglion cyst excision Right 2000  . Dental surgery  1993  . 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 Total hip arthroplasty Left 07/17/2012    Procedure: TOTAL HIP ARTHROPLASTY;  Surgeon: Kerin Salen, MD;  Location: [place] [facility];  Service: Orthopedics;  Laterality: Left;    Family History  Problem Relation Age of Onset  . Parkinson's disease Father   . Alcohol abuse Father   . Arthritis Maternal Grandmother   . Other Mother     ? ovarian tumors    Allergies  Allergen Reactions  . Fosamax [Alendronate Sodium]     Poor dentition  , bone implant  . Latex Rash  . Penicillins Rash    Current Outpatient Prescriptions on File Prior to Visit  Medication Sig Dispense Refill  . aspirin EC 81 MG tablet Take 81 mg by mouth daily.    . Calcium Carbonate-Vitamin D (CALCIUM 600+D) 600-400 MG-UNIT per tablet Take 2  tablets by mouth daily.    . Cholecalciferol (VITAMIN D) 2000 UNITS CAPS Take 1 capsule by mouth daily.    Marland Kitchen docusate sodium (COLACE) 100 MG capsule Take 100 mg by mouth every 6 (six) hours as needed for constipation.    . saccharomyces boulardii (FLORASTOR) 250 MG capsule Take 250 mg by mouth daily.     No current facility-administered medications on file prior to visit.    BP 106/70 mmHg  Pulse 74  Temp(Src) 97.8 F (36.6 C) (Oral)  Resp 16  Ht 5\' 6"  (1.676 m)  Wt 133 lb 6.4 oz (60.51 kg)  BMI 21.54 kg/m2  SpO2 99%       Objective:   Physical Exam Physical Exam  Constitutional: She is oriented to person, place, and time. She appears well-developed and well-nourished. No distress.  HENT:  Head: Normocephalic and atraumatic.  Right Ear:  Tympanic membrane and ear canal normal.  Left Ear: Tympanic membrane and ear canal normal.  Mouth/Throat: Oropharynx is clear and moist.  Eyes: Pupils are equal, round, and reactive to light. No scleral icterus.  Neck: Normal range of motion. No thyromegaly present.  Cardiovascular: Normal rate and regular rhythm.   No murmur heard. Pulmonary/Chest: Effort normal and breath sounds normal. No respiratory distress. He has no wheezes. She has no rales. She exhibits no tenderness.  Abdominal: Soft. Bowel sounds are normal. He exhibits no distension and no mass. There is no tenderness. There is no rebound and no guarding.  Musculoskeletal: She exhibits no edema.  Lymphadenopathy:    She has no cervical adenopathy.  Neurological: She is alert and oriented to person, place, and time. She has normal reflexes. She exhibits normal muscle tone. Coordination normal.  Skin: Skin is warm and dry.  Psychiatric: She has a normal mood and affect. Her behavior is normal. Judgment and thought content normal.  Breasts: Examined lying Right: Without masses, retractions, discharge or axillary adenopathy.  Left: Without masses, retractions, discharge or axillary adenopathy.          Assessment & Plan:        Assessment & Plan:  Abnormal EKG- EKG tracing is personally reviewed.  EKG notes NSR with ? L anterior fascicular block. (appears new compared to prior)  No acute changes. Asymptomatic, will refer to cardiology for further evaluation.  Preventative care- encouraged pt to continue healthy diet, exercise. Will request copy of colo from dr. Lorie Apley office.  Pap/mammo up to date. Obtain routine lab work.

## 2016-01-09 DIAGNOSIS — F419 Anxiety disorder, unspecified: Secondary | ICD-10-CM | POA: Diagnosis not present

## 2016-01-09 DIAGNOSIS — Z79899 Other long term (current) drug therapy: Secondary | ICD-10-CM | POA: Diagnosis not present

## 2016-01-09 DIAGNOSIS — E079 Disorder of thyroid, unspecified: Secondary | ICD-10-CM | POA: Diagnosis not present

## 2016-01-09 DIAGNOSIS — R002 Palpitations: Secondary | ICD-10-CM | POA: Diagnosis not present

## 2016-01-12 ENCOUNTER — Telehealth: Payer: Self-pay | Admitting: Family

## 2016-01-12 NOTE — Telephone Encounter (Signed)
Caller name: Brezlynn Relation to pt: self Call back number: [place]:  Reason for call: Pt left message at Physical Therapy (wrong office) requesting medical records from [facility] office  on the VM and that the pt stated that still in the [place] from the physical therapy brought in a note with info of the message that was left by pt. Called pt VM was not set up to leave message for pt to return call, and the home phone was busy and no answer.

## 2016-01-20 ENCOUNTER — Telehealth: Payer: Self-pay

## 2016-01-20 DIAGNOSIS — E538 Deficiency of other specified B group vitamins: Secondary | ICD-10-CM

## 2016-01-20 NOTE — Telephone Encounter (Signed)
Order placed. Detailed message was left on pt's voicemail, with instructions to call back with any questions or concerns in relation to results.

## 2016-01-20 NOTE — Telephone Encounter (Signed)
-----   Message from Dorothy [name], [place] sent at 07/31/2015 10:00 AM EDT ----- B12 Check

## 2016-01-21 NOTE — Progress Notes (Signed)
HPI: 65 year old female for evaluation of abnormal electrocardiogram. Electrocardiogram 11/12/2015 showed sinus rhythm with left anterior fascicular block. Since being told she had an abnormal electrocardiogram she has had occasions where her heart rate increased. She was seen at [facility] and evaluation unremarkable by report. She otherwise denies dyspnea on exertion, orthopnea, PND, pedal edema, palpitations, syncope or exertional chest pain.  Current Outpatient Prescriptions  Medication Sig Dispense Refill  . aspirin EC 81 MG tablet Take 81 mg by mouth daily.    . Calcium Carbonate-Vitamin D (CALCIUM 600+D) 600-400 MG-UNIT per tablet Take 2 tablets by mouth daily.    . Cholecalciferol (VITAMIN D) 2000 UNITS CAPS Take 1 capsule by mouth daily.    . cyanocobalamin 1000 MCG tablet Take 1,000 mcg by mouth daily.    Marland Kitchen docusate sodium (COLACE) 100 MG capsule Take 100 mg by mouth every 6 (six) hours as needed for constipation.    Marland Kitchen levothyroxine (SYNTHROID, LEVOTHROID) 100 MCG tablet Take 1 tablet (100 mcg total) by mouth daily. 90 tablet 0  . pantoprazole (PROTONIX) 40 MG tablet TAKE 1 BY MOUTH DAILY 90 tablet 0  . risedronate (ACTONEL) 35 MG tablet Take 1 tablet (35 mg total) by mouth once a week. 12 tablet 0  . saccharomyces boulardii (FLORASTOR) 250 MG capsule Take 250 mg by mouth daily.    . simvastatin (ZOCOR) 10 MG tablet Take one every evening 90 tablet 0  . valACYclovir (VALTREX) 500 MG tablet TAKE 1 BY MOUTH TWICE DAILY FOR THREE DAYS FOR COLD SORE 90 tablet 0   No current facility-administered medications for this visit.     Allergies  Allergen Reactions  . Fosamax [Alendronate Sodium]     Poor dentition  , bone implant  . Latex Rash  . Penicillins Rash     Past Medical History:  Diagnosis Date  . Allergy    seasonal  . Arthritis   . Bruises easily    more on hands and arms  . GERD (gastroesophageal reflux disease)   . History of chicken pox   .  Hyperlipidemia   . Hypothyroidism   . Left wrist fracture 12/10/2013  . Osteopenia   . Osteoporosis   . 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 DENTAL SURGERY  1993  . GANGLION CYST EXCISION Right 2000  . 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 TOTAL HIP ARTHROPLASTY Left 07/17/2012   Procedure: TOTAL HIP ARTHROPLASTY;  Surgeon: Kerin Salen, MD;  Location: [place];  Service: Orthopedics;  Laterality: Left;    Social History   Social History  . Marital status: Married    Spouse name: N/A  . Number of children: N/A  . Years of education: N/A   Occupational History  .      Designer   Social History Main Topics  . Smoking status: Former Smoker    Years: 20.00    Types: Cigarettes    Quit date: 11/11/2011  . Smokeless tobacco: Never Used  . Alcohol use 3.0 - 4.2 oz/week    5 - 7 Glasses of wine per week     Comment: occassional  . Drug use: No  . Sexual activity: Yes    Partners: Male   Other Topics Concern  . Not on file   Social History Narrative   Married   No children   Designs kitchen/baths/houses  Completed college   Enjoys tennis, garden, work on house, building things, hiking (has a mountain house)    Family History  Problem Relation Age of Onset  . Parkinson's disease Father   . Alcohol abuse Father   . Other Mother     ? ovarian tumors  . Arthritis Maternal Grandmother     ROS: no fevers or chills, productive cough, hemoptysis, dysphasia, odynophagia, melena, hematochezia, dysuria, hematuria, rash, seizure activity, orthopnea, PND, pedal edema, claudication. Remaining systems are negative.  Physical Exam:   Blood pressure 117/78, pulse 74, height 5\' 6"  (1.676 m), weight 133 lb 12.8 oz (60.7 kg).  General:  Well developed/well nourished in NAD Skin warm/dry Patient not depressed No peripheral clubbing Back-normal HEENT-normal/normal eyelids Neck  supple/normal carotid upstroke bilaterally; no bruits; no JVD; no thyromegaly chest - CTA/ normal expansion CV - RRR/normal S1 and S2; no murmurs, rubs or gallops;  PMI nondisplaced Abdomen -NT/ND, no HSM, no mass, + bowel sounds, no bruit 2+ femoral pulses, no bruits Ext-no edema, chords, 2+ DP Neuro-grossly nonfocal  ECG -Sinus rhythm, borderline right axis deviation, no ST changes.  A/P  1 abnormal electrocardiogram-Patient was noted to have a left anterior fascicular block. No other abnormalities noted. We'll arrange echocardiogram to assess LV function.  2 palpitations-there appears to be an anxiety component as she was told she had an abnormal electrocardiogram. We will schedule an echocardiogram to assess LV function. If normal I will not pursue further evaluation. Can consider monitor in the future if needed.  3 hyperlipidemia-continue statin.  Kirk Ruths, MD

## 2016-01-27 ENCOUNTER — Other Ambulatory Visit (INDEPENDENT_AMBULATORY_CARE_PROVIDER_SITE_OTHER): Payer: Medicare Other

## 2016-01-27 DIAGNOSIS — M810 Age-related osteoporosis without current pathological fracture: Secondary | ICD-10-CM | POA: Diagnosis not present

## 2016-01-27 DIAGNOSIS — E538 Deficiency of other specified B group vitamins: Secondary | ICD-10-CM | POA: Diagnosis not present

## 2016-01-27 LAB — VITAMIN B12: Vitamin B-12: 715 pg/mL (ref 211–911)

## 2016-01-28 ENCOUNTER — Ambulatory Visit (INDEPENDENT_AMBULATORY_CARE_PROVIDER_SITE_OTHER): Payer: Medicare Other | Admitting: Cardiology

## 2016-01-28 ENCOUNTER — Encounter: Payer: Self-pay | Admitting: Cardiology

## 2016-01-28 VITALS — BP 117/78 | HR 74 | Ht 66.0 in | Wt 133.8 lb

## 2016-01-28 DIAGNOSIS — R9431 Abnormal electrocardiogram [ECG] [EKG]: Secondary | ICD-10-CM | POA: Diagnosis not present

## 2016-01-28 DIAGNOSIS — R002 Palpitations: Secondary | ICD-10-CM | POA: Diagnosis not present

## 2016-01-28 NOTE — Patient Instructions (Signed)
Medication Instructions:   NO CHANGE  Testing/Procedures:  Your physician has requested that you have an echocardiogram. Echocardiography is a painless test that uses sound waves to create images of your heart. It provides your doctor with information about the size and shape of your heart and how well your heart's chambers and valves are working. This procedure takes approximately one hour. There are no restrictions for this procedure.    Follow-Up:  Your physician recommends that you schedule a follow-up appointment in: AS NEEDED PENDING TEST RESULTS      

## 2016-01-30 ENCOUNTER — Telehealth: Payer: Self-pay | Admitting: Neurology

## 2016-01-30 NOTE — Telephone Encounter (Signed)
Dorothy [name] 2050/11/17. She was calling in wanting to see if her blood work results for her B 12 were in. She has an appointment to see Dr.Jaffe on Wednesday 9/20. She is unsure if she will be able to keep it, but she was wanting to know if you could let her know about her B 12 results. Her # is [phone]. Thank you

## 2016-01-30 NOTE — Telephone Encounter (Signed)
B12 results relayed. Pt aware. May have to reschedule f/u due to travel schedule.

## 2016-02-04 ENCOUNTER — Ambulatory Visit: Payer: BLUE CROSS/BLUE SHIELD | Admitting: Neurology

## 2016-02-04 ENCOUNTER — Other Ambulatory Visit: Payer: Self-pay | Admitting: Physician Assistant

## 2016-02-04 ENCOUNTER — Ambulatory Visit: Payer: Medicare Other | Admitting: Neurology

## 2016-02-04 DIAGNOSIS — M810 Age-related osteoporosis without current pathological fracture: Secondary | ICD-10-CM

## 2016-02-18 ENCOUNTER — Ambulatory Visit (HOSPITAL_BASED_OUTPATIENT_CLINIC_OR_DEPARTMENT_OTHER): Payer: Medicare Other

## 2016-02-19 ENCOUNTER — Other Ambulatory Visit: Payer: Self-pay

## 2016-02-19 ENCOUNTER — Telehealth: Payer: Self-pay | Admitting: Family

## 2016-02-19 DIAGNOSIS — Z760 Encounter for issue of repeat prescription: Secondary | ICD-10-CM

## 2016-02-19 MED ORDER — PANTOPRAZOLE SODIUM 40 MG PO TBEC: DELAYED_RELEASE_TABLET | ORAL | 0 refills | Status: DC

## 2016-02-19 MED ORDER — SIMVASTATIN 10 MG PO TABS: ORAL_TABLET | ORAL | 0 refills | Status: DC

## 2016-02-19 MED ORDER — LEVOTHYROXINE SODIUM 100 MCG PO TABS: 100.0000 ug | ORAL_TABLET | Freq: Every day | ORAL | 0 refills | Status: DC

## 2016-02-19 NOTE — Telephone Encounter (Signed)
Sent in for refill on requested medication.

## 2016-02-19 NOTE — Telephone Encounter (Signed)
simvastatin (ZOCOR) 10 MG tablet  levothyroxine (SYNTHROID, LEVOTHROID) 100 MCG tablet  pantoprazole (PROTONIX) 40 MG tablet   Patient would like a refill of the medications above. She would like a 90 day supply of them all. Patient is going out of town tomorrow and states that if she does not get them tonight she will be without them until she comes back. Please advise.   [facility]: [facility] [address], [place], [place] [address] Patient phone: [phone]  Patient Cell [phone]

## 2016-02-19 NOTE — Telephone Encounter (Signed)
Attempted to call patient but was unable to leave a message on both home, and mobile phone.

## 2016-03-03 ENCOUNTER — Encounter: Payer: Self-pay | Admitting: *Deleted

## 2016-03-10 ENCOUNTER — Other Ambulatory Visit (HOSPITAL_BASED_OUTPATIENT_CLINIC_OR_DEPARTMENT_OTHER): Payer: Medicare Other

## 2016-03-10 ENCOUNTER — Ambulatory Visit (HOSPITAL_BASED_OUTPATIENT_CLINIC_OR_DEPARTMENT_OTHER)
Admission: RE | Admit: 2016-03-10 | Discharge: 2016-03-10 | Disposition: A | Discharge status: Home / Self Care | Payer: Medicare Other | Source: Ambulatory Visit | Attending: Cardiology | Admitting: Cardiology

## 2016-03-10 DIAGNOSIS — E785 Hyperlipidemia, unspecified: Secondary | ICD-10-CM | POA: Diagnosis not present

## 2016-03-10 DIAGNOSIS — R9431 Abnormal electrocardiogram [ECG] [EKG]: Secondary | ICD-10-CM | POA: Diagnosis not present

## 2016-03-10 DIAGNOSIS — R002 Palpitations: Secondary | ICD-10-CM | POA: Diagnosis not present

## 2016-03-10 DIAGNOSIS — Z87891 Personal history of nicotine dependence: Secondary | ICD-10-CM | POA: Diagnosis not present

## 2016-03-10 NOTE — Progress Notes (Signed)
  Echocardiogram 2D Echocardiogram has been performed.  Dorothy [name] 03/10/2016, 2:35 PM

## 2016-03-16 ENCOUNTER — Other Ambulatory Visit: Payer: Self-pay | Admitting: Family

## 2016-03-16 DIAGNOSIS — Z760 Encounter for issue of repeat prescription: Secondary | ICD-10-CM

## 2016-03-17 ENCOUNTER — Other Ambulatory Visit: Payer: Self-pay | Admitting: Family

## 2016-03-17 DIAGNOSIS — Z760 Encounter for issue of repeat prescription: Secondary | ICD-10-CM

## 2016-03-17 NOTE — Telephone Encounter (Signed)
Medication filled to pharmacy as requested.   

## 2016-03-29 DIAGNOSIS — Z23 Encounter for immunization: Secondary | ICD-10-CM | POA: Diagnosis not present

## 2016-03-31 ENCOUNTER — Telehealth: Payer: Self-pay | Admitting: Family

## 2016-03-31 DIAGNOSIS — Z87891 Personal history of nicotine dependence: Secondary | ICD-10-CM

## 2016-03-31 DIAGNOSIS — Z760 Encounter for issue of repeat prescription: Secondary | ICD-10-CM

## 2016-03-31 NOTE — Telephone Encounter (Signed)
Attempted to reach pt and received message that voice mailbox has not been set up yet. Will try again later.

## 2016-03-31 NOTE — Telephone Encounter (Signed)
My records show she is due for her follow up CT chest for screening. Order pended.

## 2016-04-02 MED ORDER — SIMVASTATIN 10 MG PO TABS: 10.0000 mg | ORAL_TABLET | Freq: Every evening | ORAL | 1 refills | Status: DC

## 2016-04-02 MED ORDER — LEVOTHYROXINE SODIUM 100 MCG PO TABS: 100.0000 ug | ORAL_TABLET | Freq: Every day | ORAL | 1 refills | Status: DC

## 2016-04-02 MED ORDER — PANTOPRAZOLE SODIUM 40 MG PO TBEC: DELAYED_RELEASE_TABLET | ORAL | 1 refills | Status: DC

## 2016-04-02 NOTE — Telephone Encounter (Signed)
Notified pt and she is agreeable to proceed with CT. Order signed. Pt requested that we send 90 day supply of simvastatin, levothyroxine and pantoprazole to [facility]. Refills sent. Pt scheduled medicare wellness exam for 06/23/16 at 1:30pm. Pt will contact rheumatologist re: Actonel Rx as she states that it needs to be changed to Fosamax now. Pt had CPE in 10/2015. Does she still need medicare wellness exam?

## 2016-05-16 ENCOUNTER — Other Ambulatory Visit: Payer: Self-pay | Admitting: Family

## 2016-05-18 NOTE — Telephone Encounter (Signed)
Pantoprazole refill was sent to [facility] on 03/17/16, #90 x 1 refill. [facility] from [facility] [place]. CAttempted to reach pt and verify if she is no longer using mail order or did she just need a 30 day supply to local [facility]? Cell # has no voicemail set up. Left message to return my call on home #.

## 2016-05-19 NOTE — Telephone Encounter (Signed)
Attempted to reach pt and left message on home # to return my call.

## 2016-05-24 NOTE — Telephone Encounter (Signed)
Pt returned my call and was advised of below refill request. Pt states she is wanting to use mail order. Advised her to call [facility] to release refill we sent them on 03/17/16. Pt voices understanding.

## 2016-05-26 ENCOUNTER — Telehealth: Payer: Self-pay | Admitting: *Deleted

## 2016-05-26 MED ORDER — VALACYCLOVIR HCL 500 MG PO TABS: ORAL_TABLET | ORAL | 0 refills | Status: DC

## 2016-05-26 NOTE — Telephone Encounter (Signed)
Received fax from [facility] for refill of valacyclovir. Refill sent.

## 2016-06-23 ENCOUNTER — Encounter: Payer: Self-pay | Admitting: Family

## 2016-06-23 ENCOUNTER — Ambulatory Visit (INDEPENDENT_AMBULATORY_CARE_PROVIDER_SITE_OTHER): Payer: Medicare Other | Admitting: Family

## 2016-06-23 ENCOUNTER — Ambulatory Visit: Payer: Medicare Other | Admitting: Family

## 2016-06-23 VITALS — BP 138/79 | HR 74 | Temp 98.0°F | Ht 65.0 in | Wt 131.8 lb

## 2016-06-23 DIAGNOSIS — Z0000 Encounter for general adult medical examination without abnormal findings: Secondary | ICD-10-CM

## 2016-06-23 DIAGNOSIS — E785 Hyperlipidemia, unspecified: Secondary | ICD-10-CM

## 2016-06-23 DIAGNOSIS — E039 Hypothyroidism, unspecified: Secondary | ICD-10-CM

## 2016-06-23 DIAGNOSIS — Z760 Encounter for issue of repeat prescription: Secondary | ICD-10-CM

## 2016-06-23 LAB — TSH: TSH: 0.67 u[IU]/mL (ref 0.35–4.50)

## 2016-06-23 LAB — LIPID PANEL
Cholesterol: 223 mg/dL — ABNORMAL HIGH (ref 0–200)
HDL: 81.6 mg/dL (ref >39.00)
LDL Cholesterol: 106 mg/dL — ABNORMAL HIGH (ref 0–99)
NonHDL: 141.36
Total CHOL/HDL Ratio: 3
Triglycerides: 177 mg/dL — ABNORMAL HIGH (ref 0.0–149.0)
VLDL: 35.4 mg/dL (ref 0.0–40.0)

## 2016-06-23 MED ORDER — ACYCLOVIR 400 MG PO TABS: ORAL_TABLET | ORAL | 0 refills | Status: DC

## 2016-06-23 MED ORDER — RANITIDINE HCL 150 MG PO TABS: 150.0000 mg | ORAL_TABLET | Freq: Two times a day (BID) | ORAL | 1 refills | Status: DC

## 2016-06-23 MED ORDER — ALENDRONATE SODIUM 70 MG PO TABS: 70.0000 mg | ORAL_TABLET | ORAL | 1 refills | Status: DC

## 2016-06-23 MED ORDER — SIMVASTATIN 10 MG PO TABS: 10.0000 mg | ORAL_TABLET | Freq: Every evening | ORAL | 1 refills | Status: DC

## 2016-06-23 NOTE — Patient Instructions (Signed)
Stop protonix, begin zantac. Stop valtrex, start acyclovir. Please complete lab work prior to leaving.

## 2016-06-23 NOTE — Progress Notes (Signed)
Pre visit review using our clinic review tool, if applicable. No additional management support is needed unless otherwise documented below in the visit note. 

## 2016-06-23 NOTE — Progress Notes (Signed)
Subjective:    Dorothy [name] is a 66 y.o. female who presents for Medicare Annual/Subsequent preventive examination.  Preventive Screening-Counseling & Management  Tobacco History  Smoking Status  . Former Smoker  . Years: 20.00  . Types: Cigarettes  . Quit date: 11/11/2011  Smokeless Tobacco  . Never Used     Problems Prior to Visit 1. Hypothyroid- maintained on synthroid.  Feels well on current dose.  Lab Results  Component Value Date   TSH 1.14 11/12/2015   2. GERD- maintained on PPI.Reports that symptoms are stable   3.  Osteoporosis- maintained on fosamax. She has been on these medications for 18 months.   Current Problems (verified) Patient Active Problem List   Diagnosis Date Noted  . White matter abnormality on MRI of brain 01/29/2015  . Preventative health care 11/05/2014  . Oral herpes 10/05/2014  . Right facial numbness 02/20/2014  . Colon polyp 10/24/2013  . Vitamin D deficiency 10/18/2012  . Elevated liver function tests 10/18/2012  . Paresthesias 07/23/2012  . Fatty liver disease, nonalcoholic [id number]  . Osteoporosis 07/23/2012  . Avascular necrosis of hip ([place]) 07/18/2012  . GERD (gastroesophageal reflux disease) 07/12/2012  . Avascular necrosis of bone of hip ([place]) 07/12/2012  . Other and unspecified hyperlipidemia 07/12/2012  . Hypothyroidism 07/12/2012  . Abnormal MRI of head 07/12/2012    Medications Prior to Visit Current Outpatient Prescriptions on File Prior to Visit  Medication Sig Dispense Refill  . aspirin EC 81 MG tablet Take 81 mg by mouth daily.    . Calcium Carbonate-Vitamin D (CALCIUM 600+D) 600-400 MG-UNIT per tablet Take 2 tablets by mouth daily.    . Cholecalciferol (VITAMIN D) 2000 UNITS CAPS Take 1 capsule by mouth daily.    . cyanocobalamin 1000 MCG tablet Take 1,000 mcg by mouth daily.    Marland Kitchen docusate sodium (COLACE) 100 MG capsule Take 100 mg by mouth every 6 (six) hours as needed for constipation.    Marland Kitchen levothyroxine  (SYNTHROID, LEVOTHROID) 100 MCG tablet Take 1 tablet (100 mcg total) by mouth daily. 90 tablet 1  . pantoprazole (PROTONIX) 40 MG tablet TAKE 1 BY MOUTH DAILY 90 tablet 1  . saccharomyces boulardii (FLORASTOR) 250 MG capsule Take 250 mg by mouth daily.    . simvastatin (ZOCOR) 10 MG tablet Take 1 tablet (10 mg total) by mouth every evening. 90 tablet 1  . valACYclovir (VALTREX) 500 MG tablet TAKE 1 BY MOUTH TWICE DAILY FOR THREE DAYS FOR COLD SORE 90 tablet 0   No current facility-administered medications on file prior to visit.     Current Medications (verified) Current Outpatient Prescriptions  Medication Sig Dispense Refill  . alendronate (FOSAMAX) 70 MG tablet Take 70 mg by mouth once a week. Take with a full glass of water on an empty stomach.    Marland Kitchen aspirin EC 81 MG tablet Take 81 mg by mouth daily.    . Calcium Carbonate-Vitamin D (CALCIUM 600+D) 600-400 MG-UNIT per tablet Take 2 tablets by mouth daily.    . Cholecalciferol (VITAMIN D) 2000 UNITS CAPS Take 1 capsule by mouth daily.    . cyanocobalamin 1000 MCG tablet Take 1,000 mcg by mouth daily.    Marland Kitchen docusate sodium (COLACE) 100 MG capsule Take 100 mg by mouth every 6 (six) hours as needed for constipation.    Marland Kitchen levothyroxine (SYNTHROID, LEVOTHROID) 100 MCG tablet Take 1 tablet (100 mcg total) by mouth daily. 90 tablet 1  . pantoprazole (PROTONIX) 40 MG tablet TAKE  1 BY MOUTH DAILY 90 tablet 1  . saccharomyces boulardii (FLORASTOR) 250 MG capsule Take 250 mg by mouth daily.    . simvastatin (ZOCOR) 10 MG tablet Take 1 tablet (10 mg total) by mouth every evening. 90 tablet 1  . valACYclovir (VALTREX) 500 MG tablet TAKE 1 BY MOUTH TWICE DAILY FOR THREE DAYS FOR COLD SORE 90 tablet 0   No current facility-administered medications for this visit.      Allergies (verified) Fosamax [alendronate sodium]; Latex; and Penicillins   PAST HISTORY  Family History Family History  Problem Relation Age of Onset  . Parkinson's disease Father    . Alcohol abuse Father   . Other Mother     ? ovarian tumors  . Arthritis Maternal Grandmother     Social History Social History  Substance Use Topics  . Smoking status: Former Smoker    Years: 20.00    Types: Cigarettes    Quit date: 11/11/2011  . Smokeless tobacco: Never Used  . Alcohol use 3.0 - 4.2 oz/week    5 - 7 Glasses of wine per week     Comment: occassional     Are there smokers in your home (other than you)? No  Risk Factors Current exercise habits: regular exercise, tennis, gardening, walks dog  Dietary issues discussed: diet is very healthy   Cardiac risk factors: advanced age (older than 1 for men, 50 for women).  Depression Screen (Note: if answer to either of the following is "Yes", a more complete depression screening is indicated)   Over the past two weeks, have you felt down, depressed or hopeless? No  Over the past two weeks, have you felt little interest or pleasure in doing things? No  Have you lost interest or pleasure in daily life? No  Do you often feel hopeless? No  Do you cry easily over simple problems? No  Activities of Daily Living In your present state of health, do you have any difficulty performing the following activities?:  Driving? No Managing money?  No Feeding yourself? No Getting from bed to chair? No . Climbing a flight of stairs? No Preparing food and eating?: No Bathing or showering? No Getting dressed: No Getting to the toilet? No Using the toilet:No Moving around from place to place: No In the past year have you fallen or had a near fall?:No   Are you sexually active?  Yes  Do you have more than one partner?  No  Hearing Difficulties: No Do you often ask people to speak up or repeat themselves? No Do you experience ringing or noises in your ears? No Do you have difficulty understanding soft or whispered voices? No   Do you feel that you have a problem with memory? No  Do you often misplace items? No  Do you  feel safe at home?  Yes  Cognitive Testing  Alert? Yes  Normal Appearance?Yes  Oriented to person? Yes  Place? Yes   Time? Yes  Recall of three objects?  Yes  Can perform simple calculations? Yes  Displays appropriate judgment?Yes  Can read the correct time from a watch face?Yes   Advanced Directives have been discussed with the patient? Yes  List the Names of Other Physician/Practitioners you currently use: 1.  Dr. Cherylann Parr any recent Medical Services you may have received from other than [facility] providers in the past year (date may be approximate).  Immunization History  Administered Date(s) Administered  . Influenza,inj,Quad PF,36+ Mos 02/20/2014  .  Pneumococcal Polysaccharide-23 07/18/2012  . Tdap 02/20/2014  . Zoster 05/17/2009    Screening Tests Health Maintenance  Topic Date Due  . PNA vac Low Risk Adult (1 of 2 - PCV13) 12/04/2015  . HIV Screening  11/11/2020 (Originally 12/03/1965)  . PAP SMEAR  10/24/2016  . MAMMOGRAM  10/22/2017  . COLONOSCOPY  08/13/2021  . TETANUS/TDAP  02/21/2024  . INFLUENZA VACCINE  Completed  . DEXA SCAN  Completed  . ZOSTAVAX  Addressed  . Hepatitis C Screening  Completed    All answers were reviewed with the patient and necessary referrals were made:  O'SULLIVAN,Dyllen Menning S., NP   06/23/2016   History reviewed: allergies, current medications, past family history, past medical history, past social history, past surgical history and problem list  Review of Systems Pertinent items are noted in HPI.    Objective:     Body mass index is 21.93 kg/m. BP 138/79 (BP Location: Right Arm, Cuff Size: Normal)   Pulse 74   Temp 98 F (36.7 C) (Oral)   Ht 5\' 5"  (1.651 m)   Wt 131 lb 12.8 oz (59.8 kg)   SpO2 100%   BMI 21.93 kg/m  Physical Exam  Constitutional: She is oriented to person, place, and time. She appears well-developed and well-nourished. No distress.  HENT:  Head: Normocephalic and atraumatic.  Right Ear: Tympanic  membrane and ear canal normal.  Left Ear: Tympanic membrane and ear canal normal.  Mouth/Throat: Oropharynx is clear and moist.  Eyes: Pupils are equal, round, and reactive to light. No scleral icterus.  Neck: Normal range of motion. No thyromegaly present.  Cardiovascular: Normal rate and regular rhythm.   No murmur heard. Pulmonary/Chest: Effort normal and breath sounds normal. No respiratory distress. He has no wheezes. She has no rales. She exhibits no tenderness.  Abdominal: Soft. Bowel sounds are normal. She exhibits no distension and no mass. There is no tenderness. There is no rebound and no guarding.  Musculoskeletal: She exhibits no edema.  Lymphadenopathy:    She has no cervical adenopathy.  Neurological: She is alert and oriented to person, place, and time. She has normal patellar reflexes. She exhibits normal muscle tone. Coordination normal.  Skin: Skin is warm and dry.  Psychiatric: She has a normal mood and affect. Her behavior is normal. Judgment and thought content normal.  Breasts: Examined lying Right: Without masses, retractions, discharge or axillary adenopathy.  Left: Without masses, retractions, discharge or axillary adenopathy.            Assessment & Plan:     Assessment:        Plan:     During the course of the visit the patient was educated and counseled about appropriate screening and preventive services including:    follow up lipid panel, TSH, immunizations reviewed and up to date  Diet review for nutrition referral? Yes ____  Not Indicated __x__   Patient Instructions (the written plan) was given to the patient.  Medicare Attestation I have personally reviewed: The patient's medical and social history Their use of alcohol, tobacco or illicit drugs Their current medications and supplements The patient's functional ability including ADLs,fall risks, home safety risks, cognitive, and hearing and visual impairment Diet and physical  activities Evidence for depression or mood disorders  The patient's weight, height, BMI, and visual acuity have been recorded in the chart.  I have made referrals, counseling, and provided education to the patient based on review of the above and I have provided  the patient with a written personalized care plan for preventive services.     O'SULLIVAN,Rylee Nuzum S., NP   06/23/2016

## 2016-06-28 ENCOUNTER — Other Ambulatory Visit: Payer: Self-pay | Admitting: *Deleted

## 2016-06-28 DIAGNOSIS — E7849 Other hyperlipidemia: Secondary | ICD-10-CM

## 2016-06-28 MED ORDER — SIMVASTATIN 20 MG PO TABS: 20.0000 mg | ORAL_TABLET | Freq: Every day | ORAL | 1 refills | Status: DC

## 2016-06-28 NOTE — Addendum Note (Signed)
Addended by: Kelle Darting A on: 06/28/2016 02:22 PM   Modules accepted: Orders

## 2016-07-06 ENCOUNTER — Other Ambulatory Visit: Payer: Medicare Other

## 2016-07-06 ENCOUNTER — Ambulatory Visit (INDEPENDENT_AMBULATORY_CARE_PROVIDER_SITE_OTHER): Payer: Medicare Other | Admitting: Neurology

## 2016-07-06 ENCOUNTER — Ambulatory Visit
Admission: RE | Admit: 2016-07-06 | Discharge: 2016-07-06 | Disposition: A | Discharge status: Home / Self Care | Payer: Medicare Other | Source: Ambulatory Visit | Attending: Physician Assistant | Admitting: Physician Assistant

## 2016-07-06 ENCOUNTER — Encounter: Payer: Self-pay | Admitting: Neurology

## 2016-07-06 VITALS — BP 124/72 | HR 92 | Ht 66.0 in | Wt 132.1 lb

## 2016-07-06 DIAGNOSIS — M810 Age-related osteoporosis without current pathological fracture: Secondary | ICD-10-CM

## 2016-07-06 DIAGNOSIS — G959 Disease of spinal cord, unspecified: Secondary | ICD-10-CM | POA: Diagnosis not present

## 2016-07-06 DIAGNOSIS — Z780 Asymptomatic menopausal state: Secondary | ICD-10-CM | POA: Diagnosis not present

## 2016-07-06 NOTE — Patient Instructions (Signed)
Follow up as needed

## 2016-07-06 NOTE — Progress Notes (Signed)
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   UPDATE: B12 in March 2017 was 296.  She was started on supplementation.  Repeat B12 from September 2017 was 715.  She is doing well.  She has not had any recurrent facial numbness.  Once in a while, she notes a "prickly" sensation on the left side of her torso when she sleeps.  It usually occurs after an activity where she has her neck back, such as looking up and reaching for something.  Usually, she just turns over in bed and it resolves.   HISTORY:  She reports that she fell off a horse and hit her head in 1996-97.  For the next 8 months, she noted sharp electric pain running down her spine whenever she would flex her neck.  This has since resolved.   She had an episode of right facial and hemi-body numbness in August 2011, lasting 2 hours.  This occurred after walking her dog down a hill.  She had two other episodes later that year, lasting between 20 to 35 minutes.  On 04/23/10, she had an MRI of the cervical spine with and without contrast performed, which revealed focus of increased T2 signal in the dorsal columns of the cervical cord, extending from mid C2 through the C3-4 level, which was also seen on an MRI of the cervical spine performed in 2005.  She was evaluated by Dr. Leta Baptist at [facility].  MRI of the brain with and without contrast was performed on 07/08/10, and reportedly revealed mild to moderate periventricular gliosis around the periatrial and occipital horn regions with few scattered subcortical foci.    MRI of the thoracic spine with and without was normal.   She underwent an LP on 08/27/10.  CSF showed WBC 1, protein 50, glucose 68, IgG 3, no OCBs, Lyme negative,  ACE 6, cytology negative, negative gram stain and culture, negative viral and fungal cultures.  Blood work, including ANA, ANCA, RPR, ACE, HIV, and hepatitis panel were reportedly negative.  It was decided to monitor and repeat imaging in one year. B12 from February 2016 was 383.  Ceruloplasmin was 26 and copper was 102.  She had an MRI of the cervical spine with and without contrast which showed non-enhancing hyperintensity in the cervical dorsal column, most pronounced at C1-C4.  It appears stable when compared to prior imaging from 2011.  She says she was previously treated.     In October, she developed a sharp head pain in the right parietal region, followed by numbness and tingling of the right side of her face, similar to prior event (sans headache).  It was brief.  She had an MRI of the brain with and without contrast performed on 03/13/14, which showed moderately extensive non-enhancing predominantly periventricular white matter lesions.  Contrast was not used in this study.   She also had been experiencing pins and needles sensation involving the left hand, which wakes her up at night.  She attributes this to sleeping on her left side, so she turns over and it resolves.  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  It can involve the second to fourth fingers or only the fifth finger.  There is no associated weakness or neck  pain.  She was referred to the [facility] for second opinion.  She was evaluated by Dr. George Hugh last month.  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  The event in 2011 was likely a transient symptoms from the cervical myelopathy.  A demyelinating disease is not suspected.   She denies history of headache or seizures.  She denies history of optic neuritis.  She has no family history of MS.  Her mother had headaches related to TMJ  dysfunction.  Her sister had an isolated seizure as a small child due to PCN allergy.  PAST MEDICAL HISTORY: Past Medical History:  Diagnosis Date  . Allergy    seasonal  . Arthritis   . Bruises easily    more on hands and arms  . GERD (gastroesophageal reflux disease)   . History of chicken pox   . Hyperlipidemia   . Hypothyroidism   . Left wrist fracture 12/10/2013  . Osteopenia   . Osteoporosis   . Paresthesia    evaluated for hemi paresthisia ; no MS; resolving    MEDICATIONS: Current Outpatient Prescriptions on File Prior to Visit  Medication Sig Dispense Refill  . alendronate (FOSAMAX) 70 MG tablet Take 1 tablet (70 mg total) by mouth once a week. Take with a full glass of water on an empty stomach. 12 tablet 1  . aspirin EC 81 MG tablet Take 81 mg by mouth daily.    Marland Kitchen acyclovir (ZOVIRAX) 400 MG tablet 1 tablet by mouth 5 times daily for 5 days at start of cold sore (Patient not taking: Reported on 07/06/2016) 90 tablet 0  . Calcium Carbonate-Vitamin D (CALCIUM 600+D) 600-400 MG-UNIT per tablet Take 2 tablets by mouth daily.    . Cholecalciferol (VITAMIN D) 2000 UNITS CAPS Take 1 capsule by mouth daily.    . cyanocobalamin 1000 MCG tablet Take 1,000 mcg by mouth daily.    Marland Kitchen docusate sodium (COLACE) 100 MG capsule Take 100 mg by mouth every 6 (six) hours as needed for constipation.    Marland Kitchen levothyroxine (SYNTHROID, LEVOTHROID) 100 MCG tablet Take 1 tablet (100 mcg total) by mouth daily. 90 tablet 1  . ranitidine (ZANTAC 150 MAXIMUM STRENGTH) 150 MG tablet Take 1 tablet (150 mg total) by mouth 2 (two) times daily. 180 tablet 1  . saccharomyces boulardii (FLORASTOR) 250 MG capsule Take 250 mg by mouth daily.    . simvastatin (ZOCOR) 20 MG tablet Take 1 tablet (20 mg total) by mouth at bedtime. 90 tablet 1   No current facility-administered medications on file prior to visit.     ALLERGIES: Allergies  Allergen Reactions  . Fosamax [Alendronate Sodium]     Poor dentition  ,  bone implant  . Latex Rash  . Penicillins Rash    FAMILY HISTORY: Family History  Problem Relation Age of Onset  . Parkinson's disease Father   . Alcohol abuse Father   . Other Mother     ? ovarian tumors  . Arthritis Maternal Grandmother     SOCIAL HISTORY: Social History   Social History  . Marital status: Married    Spouse name: N/A  . Number of children: N/A  . Years of education: N/A   Occupational History  .      Designer   Social History Main Topics  . Smoking status: Former Smoker    Years: 20.00    Types:  Cigarettes    Quit date: 11/11/2011  . Smokeless tobacco: Never Used  . Alcohol use 3.0 - 4.2 oz/week    5 - 7 Glasses of wine per week     Comment: occassional  . Drug use: No  . Sexual activity: Yes    Partners: Male   Other Topics Concern  . 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)    REVIEW OF SYSTEMS: Constitutional: No fevers, chills, or sweats, no generalized fatigue, change in appetite Eyes: No visual changes, double vision, eye pain Ear, nose and throat: No hearing loss, ear pain, nasal congestion, sore throat Cardiovascular: No chest pain, palpitations Respiratory:  No shortness of breath at rest or with exertion, wheezes GastrointestinaI: No nausea, vomiting, diarrhea, abdominal pain, fecal incontinence Genitourinary:  No dysuria, urinary retention or frequency Musculoskeletal:  No neck pain, back pain Integumentary: No rash, pruritus, skin lesions Neurological: as above Psychiatric: No depression, insomnia, anxiety Endocrine: No palpitations, fatigue, diaphoresis, mood swings, change in appetite, change in weight, increased thirst Hematologic/Lymphatic:  No purpura, petechiae. Allergic/Immunologic: no itchy/runny eyes, nasal congestion, recent allergic reactions,  rashes  PHYSICAL EXAM: Vitals:   07/06/16 1422  BP: 124/72  Pulse: 92   General: No acute distress.  Patient appears well-groomed.  normal body habitus. Head:  Normocephalic/atraumatic Eyes:  Fundi examined but not visualized Neck: supple, no paraspinal tenderness, full range of motion Heart:  Regular rate and rhythm Lungs:  Clear to auscultation bilaterally Back: No paraspinal tenderness Neurological Exam: alert and oriented to person, place, and time, recent and remote memory intact, fund of knowledge intact, attention and concentration intact, speech fluent and not dysarthric, language intact.  CN II-XII intact.  Bulk and tone normal.  Muscle strength 5/5 throughout.  Decreased vibration sensation in feet.  Sensation to temperature intact.  Deep tendon reflexes 3+ throughout.  Mild postural and kinetic tremor on finger to nose testing.  Normal station and stride.  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  16 minutes spent face to face with patient, over 50% spent discussing diagnosis.  Metta Clines, DO  CC:  [name], NP

## 2016-08-11 ENCOUNTER — Other Ambulatory Visit: Payer: Medicare Other

## 2016-08-12 ENCOUNTER — Encounter: Payer: Self-pay | Admitting: Family

## 2016-08-12 ENCOUNTER — Other Ambulatory Visit (INDEPENDENT_AMBULATORY_CARE_PROVIDER_SITE_OTHER): Payer: Medicare Other

## 2016-08-12 ENCOUNTER — Telehealth: Payer: Self-pay | Admitting: Family

## 2016-08-12 DIAGNOSIS — E784 Other hyperlipidemia: Secondary | ICD-10-CM | POA: Diagnosis not present

## 2016-08-12 DIAGNOSIS — E7849 Other hyperlipidemia: Secondary | ICD-10-CM

## 2016-08-12 DIAGNOSIS — Z122 Encounter for screening for malignant neoplasm of respiratory organs: Secondary | ICD-10-CM

## 2016-08-12 LAB — LIPID PANEL
Cholesterol: 219 mg/dL — ABNORMAL HIGH (ref 0–200)
HDL: 79.1 mg/dL (ref >39.00)
LDL Cholesterol: 121 mg/dL — ABNORMAL HIGH (ref 0–99)
NonHDL: 139.64
Total CHOL/HDL Ratio: 3
Triglycerides: 92 mg/dL (ref 0.0–149.0)
VLDL: 18.4 mg/dL (ref 0.0–40.0)

## 2016-08-12 NOTE — Telephone Encounter (Addendum)
Relation to EN:MMHW Call back [phone]   Reason for call:  Patient last seen 06/23/16 for physical and states PCP wanted he to have imaging done, please advise

## 2016-08-16 NOTE — Telephone Encounter (Signed)
Tried to reach pt left message to call back.  

## 2016-08-17 ENCOUNTER — Telehealth: Payer: Self-pay | Admitting: Family

## 2016-08-17 NOTE — Telephone Encounter (Signed)
I placed order for lung cancer screening.

## 2016-08-17 NOTE — Telephone Encounter (Signed)
Order has been set to [facility], awaiting appt

## 2016-08-17 NOTE — Telephone Encounter (Signed)
Left pt a message to call back. 

## 2016-08-17 NOTE — Telephone Encounter (Addendum)
Spoke with pt and gave her phone number to [facility] on [address] and she will schedule appt in the next 2 weeks. Pt inquired about recent cholesterol results and was advised per 08/12/16 lab letter. States she is currently at her home in the [place] for missing last CT but her mom had fallen and she had to spend 4 months taking care of her.

## 2016-08-17 NOTE — Telephone Encounter (Signed)
Left detailed message on voicemail that order has been placed and to call if any further questions.

## 2016-08-17 NOTE — Telephone Encounter (Signed)
Caller name: Aisia Relation to pt: self Call back number: [phone] Pharmacy:  Reason for call: Pt states is needing referral updated of a GI-315 CT since the last referral was put in on 03-2016 and pt would need the referral to be sent to the same office where she had it done last time (in [place]) pt did not give the name since she could not remember. (pt mentioned had the referral set up but could not go during that time since she had to take care of her mother. Please advise.

## 2016-08-17 NOTE — Addendum Note (Signed)
Addended by: [name] on: 08/17/2016 08:42 AM   Modules accepted: Orders

## 2016-08-24 ENCOUNTER — Ambulatory Visit: Payer: Medicare Other

## 2016-08-31 ENCOUNTER — Ambulatory Visit
Admission: RE | Admit: 2016-08-31 | Discharge: 2016-08-31 | Disposition: A | Discharge status: Home / Self Care | Payer: Medicare Other | Source: Ambulatory Visit | Attending: Family | Admitting: Family

## 2016-08-31 DIAGNOSIS — Z87891 Personal history of nicotine dependence: Secondary | ICD-10-CM

## 2016-09-09 ENCOUNTER — Telehealth: Payer: Self-pay | Admitting: Family

## 2016-09-09 NOTE — Telephone Encounter (Signed)
°  Relation to VH:SJWT Call back [place]:  Reason for call:  Patient returning call regarding imaging results

## 2016-09-09 NOTE — Telephone Encounter (Signed)
Informed her of results from CT done on 09/01/16 Dorothy [name] pt.) Documented in note too.

## 2016-09-24 ENCOUNTER — Other Ambulatory Visit: Payer: Self-pay | Admitting: Family

## 2016-09-24 DIAGNOSIS — Z760 Encounter for issue of repeat prescription: Secondary | ICD-10-CM

## 2016-10-04 ENCOUNTER — Other Ambulatory Visit: Payer: Self-pay | Admitting: Family

## 2016-10-06 NOTE — Telephone Encounter (Signed)
Received pantoprazole refill request from [facility]. Noticed that pt was changed to zantac last visit. Request denied.

## 2016-10-26 ENCOUNTER — Other Ambulatory Visit: Payer: Self-pay | Admitting: Family

## 2016-10-26 DIAGNOSIS — Z1231 Encounter for screening mammogram for malignant neoplasm of breast: Secondary | ICD-10-CM

## 2016-12-16 ENCOUNTER — Other Ambulatory Visit: Payer: Self-pay | Admitting: Family

## 2016-12-31 ENCOUNTER — Telehealth: Payer: Self-pay | Admitting: *Deleted

## 2016-12-31 MED ORDER — ALENDRONATE SODIUM 70 MG PO TABS: 70.0000 mg | ORAL_TABLET | ORAL | 1 refills | Status: DC

## 2016-12-31 NOTE — Telephone Encounter (Signed)
Received call from [facility] requesting refill of fosamax. Verbal auth given for # 12 x 1 refill.

## 2017-01-12 ENCOUNTER — Ambulatory Visit: Payer: Medicare Other | Admitting: Family

## 2017-01-25 ENCOUNTER — Ambulatory Visit
Admission: RE | Admit: 2017-01-25 | Discharge: 2017-01-25 | Disposition: A | Discharge status: Home / Self Care | Payer: Medicare Other | Source: Ambulatory Visit | Attending: Family | Admitting: Family

## 2017-01-25 ENCOUNTER — Ambulatory Visit (INDEPENDENT_AMBULATORY_CARE_PROVIDER_SITE_OTHER): Payer: Medicare Other | Admitting: Family

## 2017-01-25 ENCOUNTER — Encounter: Payer: Self-pay | Admitting: Family

## 2017-01-25 VITALS — BP 112/80 | HR 75 | Temp 97.6°F | Resp 16 | Ht 66.0 in | Wt 130.6 lb

## 2017-01-25 DIAGNOSIS — Z23 Encounter for immunization: Secondary | ICD-10-CM

## 2017-01-25 DIAGNOSIS — E784 Other hyperlipidemia: Secondary | ICD-10-CM | POA: Diagnosis not present

## 2017-01-25 DIAGNOSIS — M79646 Pain in unspecified finger(s): Secondary | ICD-10-CM

## 2017-01-25 DIAGNOSIS — E785 Hyperlipidemia, unspecified: Secondary | ICD-10-CM | POA: Diagnosis not present

## 2017-01-25 DIAGNOSIS — E7849 Other hyperlipidemia: Secondary | ICD-10-CM

## 2017-01-25 DIAGNOSIS — E039 Hypothyroidism, unspecified: Secondary | ICD-10-CM | POA: Diagnosis not present

## 2017-01-25 DIAGNOSIS — M810 Age-related osteoporosis without current pathological fracture: Secondary | ICD-10-CM

## 2017-01-25 DIAGNOSIS — K219 Gastro-esophageal reflux disease without esophagitis: Secondary | ICD-10-CM | POA: Diagnosis not present

## 2017-01-25 DIAGNOSIS — L729 Follicular cyst of the skin and subcutaneous tissue, unspecified: Secondary | ICD-10-CM

## 2017-01-25 DIAGNOSIS — Z760 Encounter for issue of repeat prescription: Secondary | ICD-10-CM

## 2017-01-25 DIAGNOSIS — Z1231 Encounter for screening mammogram for malignant neoplasm of breast: Secondary | ICD-10-CM

## 2017-01-25 LAB — TSH: TSH: 0.57 u[IU]/mL (ref 0.35–4.50)

## 2017-01-25 LAB — LIPID PANEL
Cholesterol: 184 mg/dL (ref 0–200)
HDL: 70.1 mg/dL (ref >39.00)
LDL Cholesterol: 88 mg/dL (ref 0–99)
NonHDL: 114.35
Total CHOL/HDL Ratio: 3
Triglycerides: 132 mg/dL (ref 0.0–149.0)
VLDL: 26.4 mg/dL (ref 0.0–40.0)

## 2017-01-25 MED ORDER — ACYCLOVIR 400 MG PO TABS: ORAL_TABLET | ORAL | 0 refills | Status: DC

## 2017-01-25 MED ORDER — ZOSTER VAC RECOMB ADJUVANTED 50 MCG/0.5ML IM SUSR: 0.5000 mL | Freq: Once | INTRAMUSCULAR | 1 refills | Status: AC

## 2017-01-25 NOTE — Progress Notes (Signed)
Subjective:    Patient ID: Dorothy [name], female    DOB: 07-Mar-1951, 66 y.o.   MRN: [id number]  HPI   Dorothy [name] is a 66 yr old female who presents today for routine follow up.  1) Hypothyroid- she is maintained on synthroid 168mcg.  Lab Results  Component Value Date   TSH 0.67 06/23/2016   2) Hyperlipidemia-  Maintained on simvastatin 20mg .  Lab Results  Component Value Date   CHOL 219 (H) 08/12/2016   HDL 79.10 08/12/2016   LDLCALC 121 (H) 08/12/2016   TRIG 92.0 08/12/2016   CHOLHDL 3 08/12/2016   3) Finger pain- reports previous injury to the right index pain. Now experiencing pain/stiffness. She will arrange follow up with Dr.  Veverly Fells.   4) GERD- maintained on zantac. Reports well controlled.    5) osteoporosis- continues fosamax.     Review of Systems  See HPI  Past Medical History:  Diagnosis Date  . Allergy    seasonal  . Arthritis   . Bruises easily    more on hands and arms  . GERD (gastroesophageal reflux disease)   . History of chicken pox   . Hyperlipidemia   . Hypothyroidism   . Left wrist fracture 12/10/2013  . Osteopenia   . Osteoporosis   . 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 Marital status: Married    Spouse name: N/A  . Number of children: N/A  . Years of education: N/A   Occupational History  .      Designer   Social History Main Topics  . Smoking status: Former Smoker    Years: 20.00    Types: Cigarettes    Quit date: 11/11/2011  . Smokeless tobacco: Never Used  . Alcohol use 3.0 - 4.2 oz/week    5 - 7 Glasses of wine per week     Comment: occassional  . Drug use: No  . Sexual activity: Yes    Partners: Male   Other Topics Concern  . 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 DENTAL SURGERY  1993  . GANGLION CYST EXCISION Right 2000  . 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 Parkinson's disease Father   . Alcohol abuse Father   . Other Mother        ? ovarian tumors  . Arthritis Maternal Grandmother     Allergies  Allergen Reactions  . Fosamax [Alendronate Sodium]     Poor dentition  , bone implant  . Latex Rash  . Penicillins Rash    Current Outpatient Prescriptions on File Prior to Visit  Medication Sig Dispense Refill  . acyclovir (ZOVIRAX) 400 MG tablet 1 tablet by mouth 5 times daily for 5 days at start of cold sore 90 tablet 0  . alendronate (FOSAMAX) 70 MG tablet Take 1 tablet (70 mg total) by mouth once a week. Take with a  full glass of water on an empty stomach. 12 tablet 1  . aspirin EC 81 MG tablet Take 81 mg by mouth daily.    . Calcium Carbonate-Vitamin D (CALCIUM 600+D) 600-400 MG-UNIT per tablet Take 2 tablets by mouth daily.    . Cholecalciferol (VITAMIN D) 2000 UNITS CAPS Take 1 capsule by mouth daily.    . cyanocobalamin 1000 MCG tablet Take 1,000 mcg by mouth daily.    Marland Kitchen docusate sodium (COLACE) 100 MG capsule Take 100 mg by mouth every 6 (six) hours as needed for constipation.    Marland Kitchen levothyroxine (SYNTHROID, LEVOTHROID) 100 MCG tablet TAKE 1 TABLET DAILY 90 tablet 1  . ranitidine (ZANTAC) 150 MG tablet TAKE 1 TABLET TWICE A DAY 180 tablet 1  . saccharomyces boulardii (FLORASTOR) 250 MG capsule Take 250 mg by mouth daily.    . simvastatin (ZOCOR) 20 MG tablet TAKE 1 TABLET AT BEDTIME 90 tablet 1   No current facility-administered medications on file prior to visit.     BP 112/80 (BP Location: Left Arm, Cuff Size: Normal)   Pulse 75   Temp 97.6  F (36.4 C) (Oral)   Resp 16   Ht 5\' 6"  (1.676 m)   Wt 130 lb 9.6 oz (59.2 kg)   SpO2 100%   BMI 21.08 kg/m        Objective:   Physical Exam  Constitutional: She is oriented to person, place, and time. She appears well-developed and well-nourished.  HENT:  Head: Normocephalic and atraumatic.  Neck:  Small pea sized cyst noted on right upper/anterior neck  Cardiovascular: Normal rate, regular rhythm and normal heart sounds.   No murmur heard. Pulmonary/Chest: Effort normal and breath sounds normal. No respiratory distress. She has no wheezes.  Musculoskeletal: She exhibits no edema.  Neurological: She is alert and oriented to person, place, and time.  Skin: Skin is warm and dry.  Psychiatric: She has a normal mood and affect. Her behavior is normal. Judgment and thought content normal.          Assessment & Plan:  Finger pain-advised patient to arrange follow-up with her orthopedist.  Skin cyst-noted on right upper neck. Will obtain ultrasound to further evaluate.

## 2017-01-25 NOTE — Patient Instructions (Signed)
Please schedule ultrasound on the first floor. Complete lab work prior to leaving.

## 2017-01-28 ENCOUNTER — Telehealth: Payer: Self-pay | Admitting: Family

## 2017-01-28 ENCOUNTER — Other Ambulatory Visit: Payer: Self-pay | Admitting: Family

## 2017-01-28 DIAGNOSIS — E039 Hypothyroidism, unspecified: Secondary | ICD-10-CM

## 2017-01-28 MED ORDER — LEVOTHYROXINE SODIUM 88 MCG PO TABS: 88.0000 ug | ORAL_TABLET | Freq: Every day | ORAL | 1 refills | Status: DC

## 2017-01-28 NOTE — Telephone Encounter (Signed)
LMOVM for pt to RTC/also created future order for TSH/thx dmf

## 2017-01-28 NOTE — Telephone Encounter (Signed)
Please contact patient and let her know that I reviewed her thyroid testing. It appears that her thyroid dose is slightly too strong for her. I would like her to decrease Synthroid dose from 100 g once daily down to 88 g once daily. She should repeat TSH in 4-6 weeks. I have sent a new prescription to her pharmacy. Cholesterol looks good.

## 2017-01-28 NOTE — Telephone Encounter (Signed)
Left message for pt to return my call on Monday.

## 2017-01-28 NOTE — Assessment & Plan Note (Signed)
Stable on Zantac continue same.

## 2017-01-28 NOTE — Assessment & Plan Note (Signed)
Continues Fosamax. She is also maintained on Caltrate and vitamin D. Continue same

## 2017-01-28 NOTE — Assessment & Plan Note (Signed)
Lab Results  Component Value Date   TSH 0.57 01/25/2017  TSH a bit low. She did ask me during her exam today if the Synthroid could make her feel "nervous." Will decrease her Synthroid from 100 g once daily to 88 g once daily with a goal TSH of 1-2. Plan to repeat TSH in 4-6 weeks.

## 2017-01-28 NOTE — Assessment & Plan Note (Signed)
Lab Results  Component Value Date   CHOL 184 01/25/2017   HDL 70.10 01/25/2017   LDLCALC 88 01/25/2017   TRIG 132.0 01/25/2017   CHOLHDL 3 01/25/2017   Follow-up lipid panel is improved. Continue simvastatin 20 mg.

## 2017-01-31 NOTE — Telephone Encounter (Signed)
Attempted to reach pt and received recording that "all circuits are busy and to try again".

## 2017-02-08 NOTE — Telephone Encounter (Signed)
Left message for pt to return my call. Also mailed letter.

## 2017-02-17 DIAGNOSIS — R140 Abdominal distension (gaseous): Secondary | ICD-10-CM | POA: Diagnosis not present

## 2017-02-17 DIAGNOSIS — K219 Gastro-esophageal reflux disease without esophagitis: Secondary | ICD-10-CM | POA: Diagnosis not present

## 2017-02-17 DIAGNOSIS — R194 Change in bowel habit: Secondary | ICD-10-CM | POA: Diagnosis not present

## 2017-03-15 DIAGNOSIS — K219 Gastro-esophageal reflux disease without esophagitis: Secondary | ICD-10-CM | POA: Diagnosis not present

## 2017-03-15 DIAGNOSIS — K5904 Chronic idiopathic constipation: Secondary | ICD-10-CM | POA: Diagnosis not present

## 2017-03-15 DIAGNOSIS — R140 Abdominal distension (gaseous): Secondary | ICD-10-CM | POA: Diagnosis not present

## 2017-03-29 DIAGNOSIS — H11442 Conjunctival cysts, left eye: Secondary | ICD-10-CM | POA: Diagnosis not present

## 2017-05-22 ENCOUNTER — Other Ambulatory Visit: Payer: Self-pay | Admitting: Family

## 2017-06-07 ENCOUNTER — Other Ambulatory Visit: Payer: Self-pay | Admitting: Family

## 2017-06-07 NOTE — Telephone Encounter (Signed)
LV: 01/25/2017 NV: 08/02/2017 LR: 03/10/2017

## 2017-08-02 ENCOUNTER — Ambulatory Visit: Payer: Medicare Other | Admitting: Family

## 2017-08-26 ENCOUNTER — Telehealth: Payer: Self-pay | Admitting: Family

## 2017-08-26 NOTE — Telephone Encounter (Signed)
Levothyroxine refill sent to pharmacy. Pt is due for follow up with Melissa now.  Please call pt to schedule appointment soon. Thanks!

## 2017-08-29 NOTE — Telephone Encounter (Signed)
Called pt and LVM advising to call back and schedule a follow up visit before any further Rx could be filled.

## 2017-09-13 ENCOUNTER — Ambulatory Visit (INDEPENDENT_AMBULATORY_CARE_PROVIDER_SITE_OTHER): Payer: Medicare Other | Admitting: Family

## 2017-09-13 ENCOUNTER — Encounter: Payer: Self-pay | Admitting: Family

## 2017-09-13 VITALS — BP 120/83 | HR 70 | Temp 97.8°F | Resp 16 | Ht 66.0 in | Wt 130.2 lb

## 2017-09-13 DIAGNOSIS — R0789 Other chest pain: Secondary | ICD-10-CM

## 2017-09-13 DIAGNOSIS — S51851A Open bite of right forearm, initial encounter: Secondary | ICD-10-CM

## 2017-09-13 DIAGNOSIS — K219 Gastro-esophageal reflux disease without esophagitis: Secondary | ICD-10-CM

## 2017-09-13 DIAGNOSIS — W540XXA Bitten by dog, initial encounter: Secondary | ICD-10-CM | POA: Diagnosis not present

## 2017-09-13 DIAGNOSIS — E039 Hypothyroidism, unspecified: Secondary | ICD-10-CM

## 2017-09-13 DIAGNOSIS — R079 Chest pain, unspecified: Secondary | ICD-10-CM

## 2017-09-13 LAB — TSH: TSH: 1.4 u[IU]/mL (ref 0.35–4.50)

## 2017-09-13 LAB — TROPONIN I: TNIDX: 0 ug/l (ref 0.00–0.06)

## 2017-09-13 MED ORDER — OMEPRAZOLE 40 MG PO CPDR: 40.0000 mg | DELAYED_RELEASE_CAPSULE | Freq: Every day | ORAL | 1 refills | Status: DC

## 2017-09-13 MED ORDER — GI COCKTAIL ~~LOC~~
30.0000 mL | Freq: Once | ORAL | Status: AC
  Administered 2017-09-13: 30 mL via ORAL

## 2017-09-13 NOTE — Patient Instructions (Addendum)
Stop zanatac, and begin omeprazole 40mg  once daily. Over the counter dose is 20mg  so take 2 tabs of omeprazole over the counter until your mail deliver comes.   Please complete lab work prior to leaving. You should be contacted about your referral to cardiology. If you develop chest pain which does not resolve with use of tums please proceed to the ER. You will be contacted about your referral to cardiology. Call if you develop redness/drainage from the wound on your forearm.

## 2017-09-13 NOTE — Progress Notes (Signed)
Subjective:    Patient ID: Dorothy [name], female    DOB: 1951-01-15, 67 y.o.   MRN: [id number]  HPI  Dorothy [name] is a 67 yr old female who presents today for follow up.  Hypothyroid- maintained on synthroid 88 mcg. Reports feeling well on current dose of synthroid.  Lab Results  Component Value Date   TSH 0.57 01/25/2017   Hyperlipidemia- maintained on simvastatin 20mg .  Lab Results  Component Value Date   CHOL 184 01/25/2017   HDL 70.10 01/25/2017   LDLCALC 88 01/25/2017   TRIG 132.0 01/25/2017   CHOLHDL 3 01/25/2017   GERD- continues zantac 150mg . She continues xanax bid.   Osteoporosis- continues fosamax. Had dexa 2018.   Dog bite- last Tuesday- her dog bit her right forearm. She has been dressing the wound daily with antibiotic ointment. She had the dog euthanized.    Reports that for 2-3 weeks she has woken up in the AM with "aching" in her upper chest. Throat has felt "burning" has had a lot of belching. Reports that she hurts right now. Had pain across her back across her diaphragm.  Back pain has resolved.  Has had a lot of burping as well.    Review of Systems    see HPI  Past Medical History:  Diagnosis Date  . Allergy    seasonal  . Arthritis   . Bruises easily    more on hands and arms  . GERD (gastroesophageal reflux disease)   . History of chicken pox   . Hyperlipidemia   . Hypothyroidism   . Left wrist fracture 12/10/2013  . Osteoporosis   . 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 Marital status: Married    Spouse name: Not on file  . Number of children: Not on file  . Years of education: Not on file  . Highest education level: Not on file  Occupational History    Comment: Designer  Social Needs  . Financial resource strain: Not on file  . Food insecurity:    Worry: Not on file    Inability: Not on file  . Transportation needs:    Medical: Not on file    Non-medical: Not on file  Tobacco Use  . Smoking status: Former Smoker    Years: 20.00    Types: Cigarettes    Last attempt to quit: 11/11/2011    Years since quitting: 5.8  . Smokeless tobacco: Never Used  Substance and Sexual Activity  . Alcohol use: Yes    Alcohol/week: 3.0 - 4.2 oz    Types: 5 - 7 Glasses of wine per week    Comment: occassional  . Drug use: No  . Sexual activity: Yes    Partners: Male  Lifestyle  . Physical activity:    Days per week: Not on file    Minutes per session: Not on file  . Stress: Not on file  Relationships  . Social connections:    Talks on phone: Not on file    Gets together: Not on file    Attends religious service: Not on file    Active member of club or organization: Not on file    Attends meetings of clubs or organizations: Not on file    Relationship status: Not on file  . Intimate partner violence:    Fear of current or ex partner: Not on file  Emotionally abused: Not on file    Physically abused: Not on file    Forced sexual activity: Not on file  Other Topics Concern  . 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 DENTAL SURGERY  1993  . GANGLION CYST EXCISION Right 2000  . 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 Parkinson's disease Father   . Alcohol abuse Father   . Other Mother        ? ovarian tumors  . Arthritis Maternal Grandmother   . Breast cancer Neg Hx     Allergies  Allergen Reactions  . Fosamax [Alendronate Sodium]     Poor dentition  , bone implant  . Latex Rash   . Penicillins Rash    Current Outpatient Medications on File Prior to Visit  Medication Sig Dispense Refill  . acyclovir (ZOVIRAX) 400 MG tablet 1 tablet by mouth 5 times daily for 5 days at start of cold sore 90 tablet 0  . alendronate (FOSAMAX) 70 MG tablet TAKE 1 TABLET ONCE WEEKLY  WITH A FULL GLASS OF WATER ON AN EMPTY STOMACH 12 tablet 1  . aspirin EC 81 MG tablet Take 81 mg by mouth daily.    . Calcium Carbonate-Vitamin D (CALCIUM 600+D) 600-400 MG-UNIT per tablet Take 2 tablets by mouth daily.    . Cholecalciferol (VITAMIN D) 2000 UNITS CAPS Take 1 capsule by mouth daily.    . cyanocobalamin 1000 MCG tablet Take 1,000 mcg by mouth daily.    Marland Kitchen docusate sodium (COLACE) 100 MG capsule Take 100 mg by mouth every 6 (six) hours as needed for constipation.    Marland Kitchen levothyroxine (SYNTHROID, LEVOTHROID) 88 MCG tablet TAKE 1 TABLET BY MOUTH EVERY DAY 90 tablet 0  . ranitidine (ZANTAC) 150 MG tablet TAKE 1 TABLET TWICE A DAY 180 tablet 1  . saccharomyces boulardii (FLORASTOR) 250 MG capsule Take 250 mg by mouth daily.    . simvastatin (ZOCOR) 20 MG tablet TAKE 1 TABLET AT BEDTIME 90 tablet 1   No current facility-administered medications on file prior to visit.     BP 120/83 (BP Location: Right Arm, Cuff Size: Normal)   Pulse 70   Temp 97.8 F (36.6 C) (Oral)   Resp 16   Ht 5\' 6"  (1.676 m)   Wt 130 lb 3.2 oz (59.1 kg)   SpO2 100%   BMI 21.01 kg/m    Objective:   Physical Exam  Constitutional: She appears well-developed and well-nourished.  Cardiovascular: Normal rate, regular rhythm and normal heart sounds.  No murmur heard. Pulmonary/Chest: Effort normal and breath sounds normal. No respiratory distress. She has no wheezes.  Psychiatric: Her behavior is normal. Judgment and thought content normal. Her mood appears anxious.  Trembling initially due to anxiety but this resolved during visit  Skin:  Right forearm bit wound is noted- approx 1 inch in diameter. No drainage or  significant surrounding erythema noted.         Assessment & Plan:  Atypical chest pain- pt  was given a GI cocktail. Following GI cocktail symptoms were significantly improved. EKG tracing is personally reviewed.  EKG notes NSR.  No acute changes. My suspicion for cardiac etiology is low.  Will obtain a stat troponin level and refer to cardiology for further risk factor stratification. She is advised to proceed to the ED if recurrent chest pain which is not improved by tums.    GERD- uncontrolled. I suspect her chest discomfort is due to uncontrolled gerd symptoms. D/c ranitidine. Start omeprazole 40mg  once daily.  Dog bite- appears to be healing well and no sign of infection. Wound is redressed.  Monitor.   Hypothyroid- clinically stable on synthroid, obtain follow up tsh.

## 2017-10-18 ENCOUNTER — Ambulatory Visit: Payer: Medicare Other | Admitting: Family

## 2017-10-25 DIAGNOSIS — M25552 Pain in left hip: Secondary | ICD-10-CM | POA: Diagnosis not present

## 2017-10-26 ENCOUNTER — Ambulatory Visit (INDEPENDENT_AMBULATORY_CARE_PROVIDER_SITE_OTHER): Payer: Medicare Other | Admitting: Cardiology

## 2017-10-26 ENCOUNTER — Encounter: Payer: Self-pay | Admitting: Cardiology

## 2017-10-26 ENCOUNTER — Encounter: Payer: Self-pay | Admitting: Family

## 2017-10-26 ENCOUNTER — Ambulatory Visit (INDEPENDENT_AMBULATORY_CARE_PROVIDER_SITE_OTHER): Payer: Medicare Other | Admitting: Family

## 2017-10-26 VITALS — BP 120/72 | HR 82 | Temp 97.6°F | Resp 18 | Ht 66.0 in | Wt 129.6 lb

## 2017-10-26 VITALS — BP 118/72 | HR 79 | Ht 66.0 in | Wt 129.0 lb

## 2017-10-26 DIAGNOSIS — K219 Gastro-esophageal reflux disease without esophagitis: Secondary | ICD-10-CM | POA: Diagnosis not present

## 2017-10-26 DIAGNOSIS — R0789 Other chest pain: Secondary | ICD-10-CM | POA: Diagnosis not present

## 2017-10-26 MED ORDER — RANITIDINE HCL 150 MG PO TABS: 150.0000 mg | ORAL_TABLET | Freq: Two times a day (BID) | ORAL | Status: DC

## 2017-10-26 NOTE — Patient Instructions (Addendum)
Medication Instructions:  Your physician recommends that you continue on your current medications as directed. Please refer to the Current Medication list given to you today.   Labwork: None  Testing/Procedures: Your physician has requested that you have a stress echocardiogram. For further information please visit [email or web address]. Please follow instruction sheet as given.  Follow-Up: Your physician recommends that you schedule a follow-up appointment in: 3 Months   Any Other Special Instructions Will Be Listed Below (If Applicable).     If you need a refill on your cardiac medications before your next appointment, please call your pharmacy.    Exercise Stress Echocardiogram An exercise stress echocardiogram is a test to check how well your heart is working. This test uses sound waves (ultrasound) and a computer to make images of your heart before and after exercise. Ultrasound images that are taken before you exercise (your resting echocardiogram) will show how much blood is getting to your heart muscle and how well your heart muscle and heart valves are functioning. During the next part of this test, you will walk on a treadmill or ride a stationary bike to see how exercise affects your heart. While you exercise, the electrical activity of your heart will be monitored with an electrocardiogram (ECG). Your blood pressure will also be monitored. You may have this test if you:  Have chest pain or other symptoms of a heart problem.  Recently had a heart attack or heart surgery.  Have heart valve problems.  Have a condition that causes narrowing of the blood vessels that supply your heart (coronary artery disease).  Have a high risk of heart disease and are starting a new exercise program.  Have a high risk of heart disease and need to have major surgery.  Tell a health care provider about:  Any allergies you have.  All medicines you are taking, including vitamins,  herbs, eye drops, creams, and over-the-counter medicines.  Any problems you or family members have had with anesthetic medicines.  Any blood disorders you have.  Any surgeries you have had.  Any medical conditions you have.  Whether you are pregnant or may be pregnant. What are the risks? Generally, this is a safe procedure. However, problems may occur, including:  Chest pain.  Dizziness or light-headedness.  Shortness of breath.  Increased or irregular heartbeat (palpitations).  Nausea or vomiting.  Heart attack (very rare).  What happens before the procedure?  Follow instructions from your health care provider about eating or drinking restrictions. You may be asked to avoid all forms of caffeine for 24 hours before your procedure, or as told by your health care provider.  Ask your health care provider about changing or stopping your regular medicines. This is especially important if you are taking diabetes medicines or blood thinners.  If you use an inhaler, bring it with you to the test.  Wear loose, comfortable clothing and walking shoes.  Do notuse any products that contain nicotine or tobacco, such as cigarettes and e-cigarettes, for 4 hours before the test or as told by your health care provider. If you need help quitting, ask your health care provider. What happens during the procedure?  You will take off your clothes from the waist up and put on a hospital gown.  A technician will place electrodes on your chest.  A blood pressure cuff will be placed on your arm.  You will lie down on a table for an ultrasound exam before you exercise. Gel will be rubbed on  your chest, and a handheld device (transducer) will be pressed against your chest and moved over your heart.  Then, you will start exercising by walking on a treadmill or pedaling a stationary bicycle.  Your blood pressure and heart rhythm will be monitored while you exercise.  The exercise will  gradually get harder or faster.  You will exercise until: ? Your heart reaches a target level. ? You are too tired to continue. ? You cannot continue because of chest pain, weakness, or dizziness.  You will have another ultrasound exam after you stop exercising. The procedure may vary among health care providers and hospitals. What happens after the procedure?  Your heart rate and blood pressure will be monitored until they return to your normal levels. Summary  An exercise stress echocardiogram is a test that uses ultrasound to check how well your heart works before and after exercise.  Before the test, follow instructions from your health care provider about stopping medications, avoiding nicotine and tobacco, and avoiding certain foods and drinks.  During the test, your blood pressure and heart rhythm will be monitored while you exercise on a treadmill or stationary bicycle. This information is not intended to replace advice given to you by your health care provider. Make sure you discuss any questions you have with your health care provider. Document Released: 05/07/2004 Document Revised: 12/24/2015 Document Reviewed: 12/24/2015 Elsevier Interactive Patient Education  2018 [employer].

## 2017-10-26 NOTE — Progress Notes (Signed)
Cardiology Office Note:    Date:  10/26/2017   ID:  Dorothy [name], DOB September 04, 1950, MRN [id number]  PCP:  [name], NP  Cardiologist:  Jenean Lindau, MD   Referring MD: [name], NP    ASSESSMENT:    1. Chest tightness   2. Gastroesophageal reflux disease, esophagitis presence not specified    PLAN:    In order of problems listed above:  1. Primary prevention stressed to the patient.  Importance of compliance with diet and medications stressed and she vocalized understanding.  Her blood pressure is stable.  Lipids were reviewed by me in diet was discussed. 2. Her chest pain is atypical for coronary artery disease.  However in view of her risk factors and the fact that fairly anxious about it I would like to assess it objectively with an exercise stress echo.  I discussed this with her and she is agreeable. 3. Patient will be seen in follow-up appointment in 3 months or earlier if the patient has any concerns    Medication Adjustments/Labs and Tests Ordered: Current medicines are reviewed at length with the patient today.  Concerns regarding medicines are outlined above.  Orders Placed This Encounter  Procedures  . ECHOCARDIOGRAM STRESS TEST   No orders of the defined types were placed in this encounter.    History of Present Illness:    Dorothy [name] is a 67 y.o. female who is being seen today for the evaluation of chest pain at the request of [name], NP.  Patient is a pleasant 67 year old female.  She has past medical history of dyslipidemia.  She mentions to me that she had an episode of substernal chest burning.  No radiation noted to the neck or to the arms.  She is a very active lady.  She tells me that she will placed and is in the symptoms are not reproducible.  She was given a acid reflux medicine by her primary care physician and her chest pain got better.  At the time of my evaluation, the patient is alert awake oriented  and in no distress.  Past Medical History:  Diagnosis Date  . Allergy    seasonal  . Arthritis   . Bruises easily    more on hands and arms  . GERD (gastroesophageal reflux disease)   . History of chicken pox   . Hyperlipidemia   . Hypothyroidism   . Left wrist fracture 12/10/2013  . Osteoporosis   . 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 DENTAL SURGERY  1993  . GANGLION CYST EXCISION Right 2000  . 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 acyclovir (ZOVIRAX) 400 MG tablet 1 tablet by mouth 5 times daily for 5 days at start of cold sore (Patient taking differently: Take 400 mg by mouth as needed. 1 tablet by mouth 5 times daily for 5 days at start of cold sore)  . alendronate (FOSAMAX) 70 MG tablet TAKE 1 TABLET ONCE WEEKLY  WITH A FULL GLASS OF WATER ON AN EMPTY STOMACH  . aspirin EC 81 MG tablet Take 81 mg by mouth daily.  . Calcium Carbonate-Vitamin D (  CALCIUM 600+D) 600-400 MG-UNIT per tablet Take 2 tablets by mouth daily.  . Cholecalciferol (VITAMIN D) 2000 UNITS CAPS Take 1 capsule by mouth daily.  . cyanocobalamin 1000 MCG tablet Take 1,000 mcg by mouth daily.  Marland Kitchen docusate sodium (COLACE) 100 MG capsule Take 200 mg by mouth daily.   Marland Kitchen levothyroxine (SYNTHROID, LEVOTHROID) 88 MCG tablet TAKE 1 TABLET BY MOUTH EVERY DAY  . ranitidine (ZANTAC) 150 MG tablet Take 1 tablet (150 mg total) by mouth 2 (two) times daily.  Marland Kitchen saccharomyces boulardii (FLORASTOR) 250 MG capsule Take 250 mg by mouth daily.  . simvastatin (ZOCOR) 20 MG tablet TAKE 1 TABLET AT BEDTIME     Allergies:   Fosamax [alendronate sodium]; Latex; and Penicillins   Social History   Socioeconomic  History  . Marital status: Married    Spouse name: Not on file  . Number of children: Not on file  . Years of education: Not on file  . Highest education level: Not on file  Occupational History    Comment: Designer  Social Needs  . Financial resource strain: Not on file  . Food insecurity:    Worry: Not on file    Inability: Not on file  . Transportation needs:    Medical: Not on file    Non-medical: Not on file  Tobacco Use  . Smoking status: Former Smoker    Years: 20.00    Types: Cigarettes    Last attempt to quit: 11/11/2011    Years since quitting: 5.9  . Smokeless tobacco: Never Used  Substance and Sexual Activity  . Alcohol use: Yes    Alcohol/week: 3.0 - 4.2 oz    Types: 5 - 7 Glasses of wine per week    Comment: occassional  . Drug use: No  . Sexual activity: Yes    Partners: Male  Lifestyle  . Physical activity:    Days per week: Not on file    Minutes per session: Not on file  . Stress: Not on file  Relationships  . Social connections:    Talks on phone: Not on file    Gets together: Not on file    Attends religious service: Not on file    Active member of club or organization: Not on file    Attends meetings of clubs or organizations: Not on file    Relationship status: Not on file  Other Topics Concern  . 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 There is no history of Breast cancer.  ROS:   Please see the history of present illness.    All other systems reviewed and are negative.  EKGs/Labs/Other Studies Reviewed:    The following studies were reviewed today: I discussed my findings with the patient at  length.  EKG was unremarkable.   Recent Labs: 09/13/2017: TSH 1.40  Recent Lipid Panel    Component Value Date/Time   CHOL 184 01/25/2017 1327   TRIG 132.0 01/25/2017 1327   HDL 70.10 01/25/2017 1327   CHOLHDL 3 01/25/2017 1327   VLDL 26.4 01/25/2017 1327   LDLCALC 88 01/25/2017 1327    Physical Exam:  VS:  BP 118/72 (BP Location: Left Arm, Patient Position: Sitting, Cuff Size: Normal)   Pulse 79   Ht 5\' 6"  (1.676 m)   Wt 129 lb (58.5 kg)   SpO2 98%   BMI 20.82 kg/m     Wt Readings from Last 3 Encounters:  10/26/17 129 lb (58.5 kg)  10/26/17 129 lb 9.6 oz (58.8 kg)  09/13/17 130 lb 3.2 oz (59.1 kg)     GEN: Patient is in no acute distress HEENT: Normal NECK: No JVD; No carotid bruits LYMPHATICS: No lymphadenopathy CARDIAC: S1 S2 regular, 2/6 systolic murmur at the apex. RESPIRATORY:  Clear to auscultation without rales, wheezing or rhonchi  ABDOMEN: Soft, non-tender, non-distended MUSCULOSKELETAL:  No edema; No deformity  SKIN: Warm and dry NEUROLOGIC:  Alert and oriented x 3 PSYCHIATRIC:  Normal affect    Signed, Jenean Lindau, MD  10/26/2017 2:34 PM    [place]

## 2017-10-26 NOTE — Progress Notes (Signed)
Subjective:    Patient ID: Dorothy [name], female    DOB: 05-Oct-1950, 67 y.o.   MRN: [id number]  HPI  Patient presents today for follow up of her GERD symptoms. Last visit she had atypical CP symptoms felt secondary to GERD. We stopped her zantac and began omeprazole. She reports resolution of gerd and CP symptoms but has had some increased bloating as well as some soft/watery stools since starting.  Review of Systems See HPI  Past Medical History:  Diagnosis Date  . Allergy    seasonal  . Arthritis   . Bruises easily    more on hands and arms  . GERD (gastroesophageal reflux disease)   . History of chicken pox   . Hyperlipidemia   . Hypothyroidism   . Left wrist fracture 12/10/2013  . Osteoporosis   . 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 Marital status: Married    Spouse name: Not on file  . Number of children: Not on file  . Years of education: Not on file  . Highest education level: Not on file  Occupational History    Comment: Designer  Social Needs  . Financial resource strain: Not on file  . Food insecurity:    Worry: Not on file    Inability: Not on file  . Transportation needs:    Medical: Not on file    Non-medical: Not on file  Tobacco Use  . Smoking status: Former Smoker    Years: 20.00    Types: Cigarettes    Last attempt to quit: 11/11/2011    Years since quitting: 5.9  . Smokeless tobacco: Never Used  Substance and Sexual Activity  . Alcohol use: Yes    Alcohol/week: 3.0 - 4.2 oz    Types: 5 - 7 Glasses of wine per week    Comment: occassional  . Drug use: No  . Sexual activity: Yes    Partners: Male  Lifestyle  . Physical activity:    Days per week: Not on file    Minutes per session: Not on file  . Stress: Not on file  Relationships  . Social connections:    Talks on phone: Not on file    Gets together: Not on file    Attends religious service: Not on  file    Active member of club or organization: Not on file    Attends meetings of clubs or organizations: Not on file    Relationship status: Not on file  . Intimate partner violence:    Fear of current or ex partner: Not on file    Emotionally abused: Not on file    Physically abused: Not on file    Forced sexual activity: Not on file  Other Topics Concern  . 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 DENTAL SURGERY  1993  . GANGLION CYST EXCISION Right 2000  . 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 Parkinson's disease Father   . Alcohol abuse Father   . Other Mother        ? ovarian tumors  . Arthritis Maternal Grandmother   . Breast cancer Neg Hx     Allergies  Allergen Reactions  . Fosamax [Alendronate Sodium]     Poor dentition  , bone implant  . Latex Rash  . Penicillins Rash    Current Outpatient Medications on File Prior to Visit  Medication Sig Dispense Refill  . acyclovir (ZOVIRAX) 400 MG tablet 1 tablet by mouth 5 times daily for 5 days at start of cold sore (Patient taking differently: Take 400 mg by mouth as needed. 1 tablet by mouth 5 times daily for 5 days at start of cold sore) 90 tablet 0  . alendronate (FOSAMAX) 70 MG tablet TAKE 1 TABLET ONCE WEEKLY  WITH A FULL GLASS OF WATER ON AN EMPTY STOMACH 12 tablet 1  . aspirin EC 81 MG tablet Take 81 mg by mouth daily.    . Calcium Carbonate-Vitamin D (CALCIUM 600+D) 600-400 MG-UNIT per tablet Take 2 tablets by mouth daily.    . Cholecalciferol (VITAMIN D) 2000 UNITS  CAPS Take 1 capsule by mouth daily.    . cyanocobalamin 1000 MCG tablet Take 1,000 mcg by mouth daily.    Marland Kitchen docusate sodium (COLACE) 100 MG capsule Take 200 mg by mouth daily.     Marland Kitchen levothyroxine (SYNTHROID, LEVOTHROID) 88 MCG tablet TAKE 1 TABLET BY MOUTH EVERY DAY 90 tablet 0  . saccharomyces boulardii (FLORASTOR) 250 MG capsule Take 250 mg by mouth daily.    . simvastatin (ZOCOR) 20 MG tablet TAKE 1 TABLET AT BEDTIME 90 tablet 1   No current facility-administered medications on file prior to visit.     BP 120/72 (BP Location: Right Arm, Cuff Size: Normal)   Pulse 82   Temp 97.6 F (36.4 C) (Oral)   Resp 18   Ht 5\' 6"  (1.676 m)   Wt 129 lb 9.6 oz (58.8 kg)   SpO2 99%   BMI 20.92 kg/m       Objective:   Physical Exam  Constitutional: She appears well-developed and well-nourished.  Cardiovascular: Normal rate, regular rhythm and normal heart sounds.  No murmur heard. Pulmonary/Chest: Effort normal and breath sounds normal. No respiratory distress. She has no wheezes.  Abdominal: Soft. Bowel sounds are normal. She exhibits no distension.  Psychiatric: She has a normal mood and affect. Her behavior is normal. Judgment and thought content normal.          Assessment & Plan:  GERD- stable. ? If her GI side effects are secondary to PPI. Will try d/c'ing PPI and putting her back on zantac. If this fails to improve her gi symptoms would recommend that she follow up with her GI specialist, Dr. Collene Mares.

## 2017-11-05 DIAGNOSIS — H1045 Other chronic allergic conjunctivitis: Secondary | ICD-10-CM | POA: Diagnosis not present

## 2017-11-05 DIAGNOSIS — H11442 Conjunctival cysts, left eye: Secondary | ICD-10-CM | POA: Diagnosis not present

## 2017-11-05 DIAGNOSIS — H10522 Angular blepharoconjunctivitis, left eye: Secondary | ICD-10-CM | POA: Diagnosis not present

## 2017-11-08 DIAGNOSIS — H10522 Angular blepharoconjunctivitis, left eye: Secondary | ICD-10-CM | POA: Diagnosis not present

## 2017-11-08 DIAGNOSIS — H1045 Other chronic allergic conjunctivitis: Secondary | ICD-10-CM | POA: Diagnosis not present

## 2017-11-08 DIAGNOSIS — H11442 Conjunctival cysts, left eye: Secondary | ICD-10-CM | POA: Diagnosis not present

## 2017-11-16 ENCOUNTER — Other Ambulatory Visit (HOSPITAL_BASED_OUTPATIENT_CLINIC_OR_DEPARTMENT_OTHER): Payer: Medicare Other

## 2017-11-25 ENCOUNTER — Other Ambulatory Visit: Payer: Self-pay | Admitting: Family

## 2017-11-26 ENCOUNTER — Other Ambulatory Visit: Payer: Self-pay | Admitting: Family

## 2017-11-28 NOTE — Telephone Encounter (Signed)
[facility] [place]). Left detailed message for pt to return my call and verify where refills should go and to schedule follow up with Melissa in October. [place] for [facility] / Triage to discuss with pt.

## 2017-11-29 NOTE — Telephone Encounter (Signed)
Left detailed message on additional home/wk # to call and verify pharmacy for refill.

## 2017-12-01 ENCOUNTER — Ambulatory Visit (HOSPITAL_BASED_OUTPATIENT_CLINIC_OR_DEPARTMENT_OTHER): Payer: Medicare Other

## 2017-12-08 ENCOUNTER — Other Ambulatory Visit: Payer: Self-pay | Admitting: Family

## 2017-12-08 NOTE — Telephone Encounter (Signed)
Copied from [place] [id number]. Topic: Quick Communication - Rx Refill/Question >> Dec 08, 2017 11:54 AM Percell Belt A wrote: Medication: levothyroxine (SYNTHROID, LEVOTHROID) 88 MCG tablet [[id number]] Has the patient contacted their pharmacy? Yes  (Agent: If no, request that the patient contact the pharmacy for the refill.) (Agent: If yes, when and what did the pharmacy advise?)  Preferred Pharmacy (with phone number or street name): [facility] [place] [place] [facility]- pt is completely out. [facility].  Pt would like it sent to mail order because she is not in town   Agent: Please be advised that RX refills may take up to 3 business days. We ask that you follow-up with your pharmacy.

## 2017-12-08 NOTE — Telephone Encounter (Signed)
Synthroid 77mcg refill Last Refill:12/02/17 # 90 Last OV: 01/1117 with Inda Castle PCP: Inda Castle [facility]

## 2017-12-09 MED ORDER — LEVOTHYROXINE SODIUM 88 MCG PO TABS: 88.0000 ug | ORAL_TABLET | Freq: Every day | ORAL | 1 refills | Status: DC

## 2017-12-17 ENCOUNTER — Other Ambulatory Visit: Payer: Self-pay | Admitting: Family

## 2018-01-24 ENCOUNTER — Ambulatory Visit (HOSPITAL_BASED_OUTPATIENT_CLINIC_OR_DEPARTMENT_OTHER)
Admission: RE | Admit: 2018-01-24 | Discharge: 2018-01-24 | Disposition: A | Discharge status: Home / Self Care | Payer: Medicare Other | Source: Ambulatory Visit | Attending: Cardiology | Admitting: Cardiology

## 2018-01-24 DIAGNOSIS — R0789 Other chest pain: Secondary | ICD-10-CM | POA: Diagnosis not present

## 2018-01-24 DIAGNOSIS — R5383 Other fatigue: Secondary | ICD-10-CM | POA: Insufficient documentation

## 2018-01-24 DIAGNOSIS — E785 Hyperlipidemia, unspecified: Secondary | ICD-10-CM | POA: Diagnosis not present

## 2018-01-24 NOTE — Progress Notes (Signed)
  Echocardiogram Echocardiogram Stress Test has been performed.  Sakoya Win T Kostantinos Tallman 01/24/2018, 2:59 PM

## 2018-01-31 ENCOUNTER — Other Ambulatory Visit: Payer: Self-pay | Admitting: Family

## 2018-01-31 DIAGNOSIS — Z1231 Encounter for screening mammogram for malignant neoplasm of breast: Secondary | ICD-10-CM

## 2018-02-21 ENCOUNTER — Ambulatory Visit: Payer: Medicare Other | Admitting: Family

## 2018-03-07 ENCOUNTER — Encounter: Payer: Self-pay | Admitting: Family

## 2018-03-07 ENCOUNTER — Telehealth: Payer: Self-pay | Admitting: Family

## 2018-03-07 ENCOUNTER — Ambulatory Visit
Admission: RE | Admit: 2018-03-07 | Discharge: 2018-03-07 | Disposition: A | Discharge status: Home / Self Care | Payer: Medicare Other | Source: Ambulatory Visit | Attending: Family | Admitting: Family

## 2018-03-07 ENCOUNTER — Encounter (INDEPENDENT_AMBULATORY_CARE_PROVIDER_SITE_OTHER): Payer: Self-pay

## 2018-03-07 ENCOUNTER — Ambulatory Visit (INDEPENDENT_AMBULATORY_CARE_PROVIDER_SITE_OTHER): Payer: Medicare Other | Admitting: Family

## 2018-03-07 VITALS — BP 124/72 | HR 90 | Temp 97.9°F | Resp 16 | Ht 66.0 in | Wt 129.0 lb

## 2018-03-07 DIAGNOSIS — Z23 Encounter for immunization: Secondary | ICD-10-CM | POA: Diagnosis not present

## 2018-03-07 DIAGNOSIS — K219 Gastro-esophageal reflux disease without esophagitis: Secondary | ICD-10-CM | POA: Diagnosis not present

## 2018-03-07 DIAGNOSIS — E7849 Other hyperlipidemia: Secondary | ICD-10-CM

## 2018-03-07 DIAGNOSIS — M810 Age-related osteoporosis without current pathological fracture: Secondary | ICD-10-CM | POA: Diagnosis not present

## 2018-03-07 DIAGNOSIS — E039 Hypothyroidism, unspecified: Secondary | ICD-10-CM

## 2018-03-07 DIAGNOSIS — Z1231 Encounter for screening mammogram for malignant neoplasm of breast: Secondary | ICD-10-CM

## 2018-03-07 LAB — COMPREHENSIVE METABOLIC PANEL
ALT: 23 U/L (ref 0–35)
AST: 30 U/L (ref 0–37)
Albumin: 4.5 g/dL (ref 3.5–5.2)
Alkaline Phosphatase: 61 U/L (ref 39–117)
BUN: 15 mg/dL (ref 6–23)
CO2: 29 mEq/L (ref 19–32)
Calcium: 10.1 mg/dL (ref 8.4–10.5)
Chloride: 100 mEq/L (ref 96–112)
Creatinine, Ser: 0.79 mg/dL (ref 0.40–1.20)
GFR: 77.09 mL/min (ref >60.00)
Glucose, Bld: 86 mg/dL (ref 70–99)
Potassium: 4.1 mEq/L (ref 3.5–5.1)
Sodium: 137 mEq/L (ref 135–145)
Total Bilirubin: 0.7 mg/dL (ref 0.2–1.2)
Total Protein: 7 g/dL (ref 6.0–8.3)

## 2018-03-07 LAB — LIPID PANEL
Cholesterol: 209 mg/dL — ABNORMAL HIGH (ref 0–200)
HDL: 82.2 mg/dL (ref >39.00)
LDL Cholesterol: 102 mg/dL — ABNORMAL HIGH (ref 0–99)
NonHDL: 127.05
Total CHOL/HDL Ratio: 3
Triglycerides: 124 mg/dL (ref 0.0–149.0)
VLDL: 24.8 mg/dL (ref 0.0–40.0)

## 2018-03-07 LAB — TSH: TSH: 2.12 u[IU]/mL (ref 0.35–4.50)

## 2018-03-07 MED ORDER — BETAMETHASONE VALERATE 0.1 % EX OINT: 1.0000 "application " | TOPICAL_OINTMENT | Freq: Two times a day (BID) | CUTANEOUS | 0 refills | Status: DC

## 2018-03-07 MED ORDER — ACYCLOVIR 400 MG PO TABS: ORAL_TABLET | ORAL | 0 refills | Status: DC

## 2018-03-07 MED FILL — BETAMETHASONE VALER 0.1% OI: 0.1 | 20 days supply | Qty: 45 | Fill #0

## 2018-03-07 NOTE — Addendum Note (Signed)
Addended by: Kelle Darting A on: 03/07/2018 01:23 PM   Modules accepted: Orders

## 2018-03-07 NOTE — Patient Instructions (Signed)
Please complete lab work prior to leaving. We will work on getting you schedule for Prolia injections for your osteoporosis.

## 2018-03-07 NOTE — Progress Notes (Signed)
Subjective:    Patient ID: Dorothy [name], female    DOB: [birth year], 67 y.o.   MRN: [id number]  HPI   Patient is a 67 yr old female who presents today for follow up.  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  Lab Results  Component Value Date   TSH 1.40 09/13/2017   GERD- last visit these symptoms were noted to be stable on PPI but she noted increased bloating as well as soft watery stools since started PPI. We stopped the PPI and instead placed her on zantac. She reports that these symptoms are resolved on zantac and gerd stable.     Review of Systems    see HPI  Past Medical History:  Diagnosis Date  . Allergy    seasonal  . Arthritis   . Bruises easily    more on hands and arms  . GERD (gastroesophageal reflux disease)   . History of chicken pox   . Hyperlipidemia   . Hypothyroidism   . Left wrist fracture 12/10/2013  . Osteoporosis   . 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 Marital status: Married    Spouse name: Not on file  . Number of children: Not on file  . Years of education: Not on file  . Highest education level: Not on file  Occupational History    Comment: Designer  Social Needs  . Financial resource strain: Not on file  . Food insecurity:    Worry: Not on file    Inability: Not on file  . Transportation needs:    Medical: Not on file    Non-medical: Not on file  Tobacco Use  . Smoking status: Former Smoker    Years: 20.00    Types: Cigarettes    Last attempt to quit: 11/11/2011    Years since quitting: 6.3  . Smokeless tobacco: Never Used  Substance and Sexual Activity  . Alcohol use: Yes    Alcohol/week: 5.0 - 7.0 standard drinks    Types: 5 - 7 Glasses of wine per week    Comment: occassional  . Drug use: No  . Sexual activity: Yes    Partners: Male  Lifestyle  . Physical activity:    Days per week: Not on file   Minutes per session: Not on file  . Stress: Not on file  Relationships  . Social connections:    Talks on phone: Not on file    Gets together: Not on file    Attends religious service: Not on file    Active member of club or organization: Not on file    Attends meetings of clubs or organizations: Not on file    Relationship status: Not on file  . Intimate partner violence:    Fear of current or ex partner: Not on file    Emotionally abused: Not on file    Physically abused: Not on file    Forced sexual activity: Not on file  Other Topics Concern  . 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 DENTAL SURGERY  1993  . GANGLION CYST EXCISION Right 2000  . TOTAL HIP ARTHROPLASTY  Left 07/17/2012   Dr Mayer Camel  . TOTAL HIP ARTHROPLASTY Left 07/17/2012   Procedure: TOTAL HIP ARTHROPLASTY;  Surgeon: Kerin Salen, MD;  Location: [place];  Service: Orthopedics;  Laterality: Left;    Family History  Problem Relation Age of Onset  . Parkinson's disease Father   . Alcohol abuse Father   . Other Mother        ? ovarian tumors  . Arthritis Maternal Grandmother   . Breast cancer Neg Hx     Allergies  Allergen Reactions  . Fosamax [Alendronate Sodium]     Poor dentition  , bone implant  . Latex Rash  . Penicillins Rash    Current Outpatient Medications on File Prior to Visit  Medication Sig Dispense Refill  . acyclovir (ZOVIRAX) 400 MG tablet 1 tablet by mouth 5 times daily for 5 days at start of cold sore (Patient taking differently: Take 400 mg by mouth as needed. 1 tablet by mouth 5 times daily for 5 days at start of cold sore) 90 tablet 0  . alendronate (FOSAMAX) 70 MG tablet TAKE 1 TABLET ONCE WEEKLY  WITH A FULL GLASS OF WATER ON AN EMPTY STOMACH 12 tablet 1  . aspirin  EC 81 MG tablet Take 81 mg by mouth daily.    . Calcium Carbonate-Vitamin D (CALCIUM 600+D) 600-400 MG-UNIT per tablet Take 2 tablets by mouth daily.    . Cholecalciferol (VITAMIN D) 2000 UNITS CAPS Take 1 capsule by mouth daily.    . cyanocobalamin 1000 MCG tablet Take 1,000 mcg by mouth daily.    Marland Kitchen docusate sodium (COLACE) 100 MG capsule Take 200 mg by mouth daily.     Marland Kitchen levothyroxine (SYNTHROID, LEVOTHROID) 88 MCG tablet Take 1 tablet (88 mcg total) by mouth daily before breakfast. 90 tablet 1  . ranitidine (ZANTAC) 150 MG tablet Take 1 tablet (150 mg total) by mouth 2 (two) times daily.    Marland Kitchen saccharomyces boulardii (FLORASTOR) 250 MG capsule Take 250 mg by mouth daily.    . simvastatin (ZOCOR) 20 MG tablet TAKE 1 TABLET AT BEDTIME 90 tablet 1  . simvastatin (ZOCOR) 20 MG tablet TAKE 1 TABLET AT BEDTIME 90 tablet 1   No current facility-administered medications on file prior to visit.     BP 124/72 (BP Location: Right Arm, Patient Position: Sitting, Cuff Size: Small)   Pulse 90   Temp 97.9 F (36.6 C) (Oral)   Resp 16   Ht 5\' 6"  (1.676 m)   Wt 129 lb (58.5 kg)   SpO2 97%   BMI 20.82 kg/m    Objective:   Physical Exam  Constitutional: She is oriented to person, place, and time. She appears well-developed and well-nourished.  Cardiovascular: Normal rate, regular rhythm and normal heart sounds.  No murmur heard. Pulmonary/Chest: Effort normal and breath sounds normal. No respiratory distress. She has no wheezes.  Musculoskeletal: She exhibits no edema.  Neurological: She is alert and oriented to person, place, and time.  Skin: Skin is warm and dry.  Some dry eczematous changes note on back.   Psychiatric: She has a normal mood and affect. Her behavior is normal. Judgment and thought content normal.          Assessment & Plan:  GERD- stable on zantac, continue same.  Eczema- recommend good moisturizer once daily and betamethasone once daily as needed.    Hypothyroid-  obtain follow up tsh. Continue synthroid  Hyperlipidemia- check follow up lipid panel.  Osteoporosis- reports  that she was on actonel for 2 years prior to fosamax. In all she has been on bisphosphonates for 5 years. She just refilled fosamax and wish to finish this. Still note of significant osteoporosis on dexa 2018. I have suggested transition off of fosamax and onto prolia. Will initiate insurance approval for prolia.   Flu shot and shingrix #1 today.

## 2018-03-07 NOTE — Telephone Encounter (Signed)
Patient would like to initiate prolia please.

## 2018-03-08 ENCOUNTER — Encounter: Payer: Self-pay | Admitting: Family

## 2018-03-10 DIAGNOSIS — T161XXA Foreign body in right ear, initial encounter: Secondary | ICD-10-CM | POA: Diagnosis not present

## 2018-03-10 DIAGNOSIS — Z0389 Encounter for observation for other suspected diseases and conditions ruled out: Secondary | ICD-10-CM | POA: Diagnosis not present

## 2018-03-10 NOTE — Telephone Encounter (Signed)
Chief Strategy Officer verified prolia benefits; awaiting determination with prolia reps to initiate prolia therapy.

## 2018-03-15 ENCOUNTER — Telehealth: Payer: Self-pay

## 2018-03-15 NOTE — Telephone Encounter (Signed)
Error

## 2018-03-15 NOTE — Telephone Encounter (Signed)
Continuation of previous documentation. No answer, unable to leave VM. Routed to [place] to re-attempt contact.

## 2018-03-15 NOTE — Telephone Encounter (Signed)
Benefits received, no PA required. Pt. May owe approximately $0 OOP. Author phoned pt. to schedule NV at earliest convenience to initiate therapy.

## 2018-03-17 NOTE — Telephone Encounter (Signed)
Attempted to reach pt and left detailed message on work voicemail regarding below information and to call and schedule appt for prolia injection.

## 2018-04-06 ENCOUNTER — Ambulatory Visit (INDEPENDENT_AMBULATORY_CARE_PROVIDER_SITE_OTHER): Payer: Medicare Other

## 2018-04-06 DIAGNOSIS — M810 Age-related osteoporosis without current pathological fracture: Secondary | ICD-10-CM | POA: Diagnosis not present

## 2018-04-06 MED ORDER — DENOSUMAB 60 MG/ML ~~LOC~~ SOSY
60.0000 mg | PREFILLED_SYRINGE | Freq: Once | SUBCUTANEOUS | Status: AC
  Administered 2018-04-06: 60 mg via SUBCUTANEOUS

## 2018-05-06 ENCOUNTER — Other Ambulatory Visit: Payer: Self-pay | Admitting: Family

## 2018-05-26 ENCOUNTER — Telehealth: Payer: Self-pay | Admitting: *Deleted

## 2018-05-26 ENCOUNTER — Other Ambulatory Visit: Payer: Self-pay

## 2018-05-26 NOTE — Telephone Encounter (Signed)
Routed to Glass blower/designer for resolution. Please advise.

## 2018-05-26 NOTE — Telephone Encounter (Signed)
Copied from [place] [id number]. Topic: General - Other >> May 25, 2018  2:14 PM Alanda Slim E wrote: Reason for CRM: Pt called in about a $270 bill for shingles shot from [facility] on 10.22.2019. Pt was administered the shot and getting ready to schedule for the second shot, Gilmore Laroche realized that the Pt had Medicare and should have had the shot at a pharmacy to be covered. Pt stated Gilmore Laroche advised her that she would not be charged for it due to office not checking or advising of that. Please give Pt a call once this is handled

## 2018-05-27 ENCOUNTER — Other Ambulatory Visit: Payer: Self-pay | Admitting: Family

## 2018-06-08 ENCOUNTER — Telehealth: Payer: Self-pay | Admitting: *Deleted

## 2018-06-08 NOTE — Telephone Encounter (Signed)
Copied from [place]. Topic: General - Other >> Jun 08, 2018  1:25 PM Windy Kalata wrote: Reason for CRM: Patient is calling in regards to this medication ranitidine (ZANTAC) 150 MG tablet, she states that she is reading online for [employer] that this medicine has a carcinogen  in it that she is concerned about. Please advise an alternative?  Best call back is [phone]

## 2018-06-09 NOTE — Telephone Encounter (Signed)
Some Zantac is on recall due to bad batch at [facility]. She can d/c Zantac and switch to provide 20mg  bid.

## 2018-06-12 ENCOUNTER — Other Ambulatory Visit: Payer: Self-pay

## 2018-06-12 MED ORDER — FAMOTIDINE 20 MG PO TABS: 20.0000 mg | ORAL_TABLET | Freq: Two times a day (BID) | ORAL | 1 refills | Status: DC

## 2018-06-12 NOTE — Telephone Encounter (Signed)
[facility] as pepcid 20 mg.

## 2018-06-12 NOTE — Progress Notes (Unsigned)
p 

## 2018-06-22 NOTE — Telephone Encounter (Signed)
Patient calling and states that she keeps getting bills for this injection. Would like this taken care of. Please advise. Requesting call back. CB#: [phone]

## 2018-06-23 NOTE — Telephone Encounter (Signed)
Routed to [facility] for review.

## 2018-07-31 ENCOUNTER — Telehealth: Payer: Self-pay | Admitting: Family

## 2018-07-31 ENCOUNTER — Other Ambulatory Visit: Payer: Self-pay | Admitting: Family

## 2018-07-31 ENCOUNTER — Telehealth: Payer: Self-pay

## 2018-07-31 NOTE — Telephone Encounter (Signed)
Copied from [place] [id number]. Topic: Quick Communication - Rx Refill/Question >> Jul 31, 2018  4:23 PM Leward Quan A wrote: Medication: acyclovir (ZOVIRAX) 400 MG tablet   Has the patient contacted their pharmacy? Yes.   (Agent: If no, request that the patient contact the pharmacy for the refill.) (Agent: If yes, when and what did the pharmacy advise?)  Preferred Pharmacy (with phone number or street name): [facility] [place], [place] [facility] [phone] (Phone) [phone] (Fax)    Agent: Please be advised that RX refills may take up to 3 business days. We ask that you follow-up with your pharmacy.

## 2018-07-31 NOTE — Telephone Encounter (Signed)
Copied from [place] [phone]. Topic: Quick Communication - Rx Refill/Question >> Jul 31, 2018  4:23 PM Leward Quan A wrote: Medication: acyclovir (ZOVIRAX) 400 MG tablet   Has the patient contacted their pharmacy? Yes.   (Agent: If no, request that the patient contact the pharmacy for the refill.) (Agent: If yes, when and what did the pharmacy advise?)  Preferred Pharmacy (with phone number or street name): [facility] [place] [facility] [place] [facility] [phone] (Phone) [phone] (Fax)    Agent: Please be advised that RX refills may take up to 3 business days. We ask that you follow-up with your pharmacy.

## 2018-07-31 NOTE — Telephone Encounter (Signed)
Copied from [place] [phone]. Topic: General - Other >> Jul 31, 2018  4:18 PM Leward Quan A wrote: Reason for CRM: Patient called to clarify if she should be taking the famotidine (PEPCID) 20 MG tablet, and ranitidine (ZANTAC) 150 MG tablet all at the same time. Please call Ph# [phone] or [phone]

## 2018-08-01 MED ORDER — ACYCLOVIR 400 MG PO TABS: ORAL_TABLET | ORAL | 0 refills | Status: DC

## 2018-08-01 NOTE — Telephone Encounter (Signed)
MyChart message sent to patient with this information.

## 2018-08-01 NOTE — Telephone Encounter (Signed)
Tried calling Pt- no answer, unable to leave voice message.

## 2018-08-01 NOTE — Telephone Encounter (Signed)
She should take pepcid only please.

## 2018-08-14 ENCOUNTER — Telehealth: Payer: Self-pay | Admitting: Family

## 2018-08-14 NOTE — Telephone Encounter (Signed)
Copied from [place] [phone]. Topic: Quick Communication - Rx Refill/Question >> Aug 14, 2018  2:58 PM [name], NT wrote: Medication:  acyclovir (ZOVIRAX) 400 MG tablet  famotidine (PEPCID) 20 MG tablet (patient is requesting 180 tablets) simvastatin (ZOCOR) 20 MG tablet levothyroxine (SYNTHROID, LEVOTHROID) 88 MCG tablet  Has the patient contacted their pharmacy? Yes, patient stated wrong medications were sent and she is in need of these medications as she is running low.  Preferred Pharmacy (with phone number or street name): [facility] [place] [place], [place] to [facility] [phone] (Phone) [phone] (Fax)  Agent: Please be advised that RX refills may take up to 3 business days. We ask that you follow-up with your pharmacy.

## 2018-08-15 ENCOUNTER — Other Ambulatory Visit: Payer: Self-pay

## 2018-08-15 MED ORDER — FAMOTIDINE 20 MG PO TABS: 20.0000 mg | ORAL_TABLET | Freq: Two times a day (BID) | ORAL | 1 refills | Status: DC

## 2018-08-15 MED ORDER — LEVOTHYROXINE SODIUM 88 MCG PO TABS: 88.0000 ug | ORAL_TABLET | Freq: Every day | ORAL | 1 refills | Status: DC

## 2018-08-15 NOTE — Telephone Encounter (Signed)
Medications due to refill were sent to her mail order.

## 2018-09-18 ENCOUNTER — Telehealth (INDEPENDENT_AMBULATORY_CARE_PROVIDER_SITE_OTHER): Payer: Medicare Other | Admitting: Family

## 2018-09-18 ENCOUNTER — Other Ambulatory Visit: Payer: Self-pay

## 2018-09-18 DIAGNOSIS — M810 Age-related osteoporosis without current pathological fracture: Secondary | ICD-10-CM | POA: Diagnosis not present

## 2018-09-18 DIAGNOSIS — D229 Melanocytic nevi, unspecified: Secondary | ICD-10-CM

## 2018-09-18 DIAGNOSIS — E039 Hypothyroidism, unspecified: Secondary | ICD-10-CM | POA: Diagnosis not present

## 2018-09-18 DIAGNOSIS — R221 Localized swelling, mass and lump, neck: Secondary | ICD-10-CM

## 2018-09-18 DIAGNOSIS — E7849 Other hyperlipidemia: Secondary | ICD-10-CM | POA: Diagnosis not present

## 2018-09-18 MED ORDER — SIMVASTATIN 20 MG PO TABS: 20.0000 mg | ORAL_TABLET | Freq: Every day | ORAL | 1 refills | Status: DC

## 2018-09-18 MED ORDER — BETAMETHASONE VALERATE 0.1 % EX OINT: 1.0000 "application " | TOPICAL_OINTMENT | Freq: Two times a day (BID) | CUTANEOUS | 0 refills | Status: DC

## 2018-09-18 NOTE — Progress Notes (Signed)
Virtual Visit via Video Note  I connected with Kerin Perna on 09/18/18 at 11:00 AM EDT by a video enabled telemedicine application and verified that I am speaking with the correct person using two identifiers. This visit type was conducted due to national recommendations for restrictions regarding the COVID-19 Pandemic (e.g. social distancing).  This format is felt to be most appropriate for this patient at this time.   I discussed the limitations of evaluation and management by telemedicine and the availability of in person appointments. The patient expressed understanding and agreed to proceed.  Only the patient and myself were on today's video visit. The patient was at home and I was in my office at the time of today's visit.   History of Present Illness:  Hyperlipidemia- maintained on statin. She continues simvastatin. Denies myalgia.   Lab Results  Component Value Date   CHOL 209 (H) 03/07/2018   HDL 82.20 03/07/2018   LDLCALC 102 (H) 03/07/2018   TRIG 124.0 03/07/2018   CHOLHDL 3 03/07/2018     Hypothyroid- reports energy is stable. Continues synthroid.  Lab Results  Component Value Date   TSH 2.12 03/07/2018   Osteoporosis- due for prolia.   C/o lump along the jaw line. Thinks that it is a fatty tumor.  Would like referral after covid and will reach out when she is ready.   Has itch mole on her back.  Would like referral to derm at a later date.     Observations/Objective:    Gen: Awake, alert, no acute distress Resp: Breathing is even and non-labored Psych: calm/pleasant demeanor Neuro: Alert and Oriented x 3, + facial symmetry, speech is clear. ENT: no obvious mass on video noted beneath jaw line.   Assessment and Plan:  Osteoporosis- plan for prolia once it is safe to begin bringing patients into the office. Continue caltrate for now.   Neck mass- plan [place] when pt is comfortable completing.   Nevus- plan derm referral when she is comfortable.    Hypothyroid- stable on synthroid, continue same. Plan TSH next visit in office.  Hyperlipidemia- Lipids nearly at goal, continue statin.  Follow Up Instructions: Stop aspirin   I discussed the assessment and treatment plan with the patient. The patient was provided an opportunity to ask questions and all were answered. The patient agreed with the plan and demonstrated an understanding of the instructions.   The patient was advised to call back or seek an in-person evaluation if the symptoms worsen or if the condition fails to improve as anticipated.    Nance Pear, NP

## 2018-10-18 ENCOUNTER — Telehealth: Payer: Self-pay | Admitting: *Deleted

## 2018-10-18 NOTE — Telephone Encounter (Signed)
Copied from [place] [id number]. Topic: Appointment Scheduling - Scheduling Inquiry for Clinic >> Oct 17, 2018  4:24 PM Celene Kras A wrote: Reason for CRM: Pt called stating she would like to be scheduled for her prolia shot. Pt states if possible she would like to be scheduled on 01/04/2019 after 4:30. Pt understands that the time may be an issue since office hours have changed. Please contact pt on house phone [phone]. Please advise.

## 2018-10-20 NOTE — Telephone Encounter (Signed)
Tried calling patient, no answer. Patient is due 11/06/2018.

## 2018-11-06 ENCOUNTER — Telehealth: Payer: Self-pay | Admitting: Family

## 2018-11-06 NOTE — Telephone Encounter (Signed)
See mychart.  

## 2018-11-15 ENCOUNTER — Telehealth: Payer: Self-pay | Admitting: Family

## 2018-11-15 NOTE — Telephone Encounter (Signed)
See mychart.  

## 2019-02-05 ENCOUNTER — Other Ambulatory Visit: Payer: Self-pay | Admitting: Family

## 2019-02-23 ENCOUNTER — Other Ambulatory Visit: Payer: Self-pay | Admitting: Family

## 2019-03-01 ENCOUNTER — Other Ambulatory Visit: Payer: Self-pay | Admitting: Family

## 2019-03-16 DIAGNOSIS — Z23 Encounter for immunization: Secondary | ICD-10-CM | POA: Diagnosis not present

## 2019-03-27 ENCOUNTER — Other Ambulatory Visit: Payer: Self-pay | Admitting: Family

## 2019-03-27 DIAGNOSIS — Z1231 Encounter for screening mammogram for malignant neoplasm of breast: Secondary | ICD-10-CM

## 2019-04-10 ENCOUNTER — Telehealth: Payer: Self-pay

## 2019-04-10 NOTE — Telephone Encounter (Signed)
I think she was afraid to come into the office due to covid.  Let's plan to check back with her in 6 months please.

## 2019-04-10 NOTE — Telephone Encounter (Signed)
I have made several attempts, several mychart messages, and sent a letter regarding patients prolia that she is long over due for. Is it necessary to continue prolia? If not I will archive her out of the portal due to communication barrier.

## 2019-05-14 ENCOUNTER — Other Ambulatory Visit: Payer: Self-pay | Admitting: Family

## 2019-05-20 ENCOUNTER — Other Ambulatory Visit: Payer: Self-pay | Admitting: Family

## 2019-05-22 ENCOUNTER — Ambulatory Visit
Admission: RE | Admit: 2019-05-22 | Discharge: 2019-05-22 | Disposition: A | Discharge status: Home / Self Care | Payer: Medicare Other | Source: Ambulatory Visit | Attending: Family | Admitting: Family

## 2019-05-22 ENCOUNTER — Other Ambulatory Visit: Payer: Self-pay

## 2019-05-22 DIAGNOSIS — Z1231 Encounter for screening mammogram for malignant neoplasm of breast: Secondary | ICD-10-CM

## 2019-06-06 DIAGNOSIS — Z23 Encounter for immunization: Secondary | ICD-10-CM | POA: Diagnosis not present

## 2019-06-20 ENCOUNTER — Telehealth: Payer: Self-pay | Admitting: Family

## 2019-06-20 NOTE — Telephone Encounter (Signed)
Called patient to schedule AWV, but no answer. Will try to call patient back at a later time. SF 

## 2019-06-27 DIAGNOSIS — Z23 Encounter for immunization: Secondary | ICD-10-CM | POA: Diagnosis not present

## 2019-08-13 DIAGNOSIS — H43811 Vitreous degeneration, right eye: Secondary | ICD-10-CM | POA: Diagnosis not present

## 2019-08-13 DIAGNOSIS — H1131 Conjunctival hemorrhage, right eye: Secondary | ICD-10-CM | POA: Diagnosis not present

## 2019-08-15 ENCOUNTER — Other Ambulatory Visit: Payer: Self-pay | Admitting: Family Medicine

## 2019-08-15 ENCOUNTER — Other Ambulatory Visit: Payer: Self-pay | Admitting: Family

## 2019-10-25 DIAGNOSIS — Z880 Allergy status to penicillin: Secondary | ICD-10-CM | POA: Diagnosis not present

## 2019-10-25 DIAGNOSIS — S61412A Laceration without foreign body of left hand, initial encounter: Secondary | ICD-10-CM | POA: Diagnosis not present

## 2019-10-25 DIAGNOSIS — S01511A Laceration without foreign body of lip, initial encounter: Secondary | ICD-10-CM | POA: Diagnosis not present

## 2019-11-15 ENCOUNTER — Other Ambulatory Visit: Payer: Self-pay | Admitting: Family

## 2020-01-10 DIAGNOSIS — H2513 Age-related nuclear cataract, bilateral: Secondary | ICD-10-CM | POA: Diagnosis not present

## 2020-01-10 DIAGNOSIS — H3509 Other intraretinal microvascular abnormalities: Secondary | ICD-10-CM | POA: Diagnosis not present

## 2020-01-10 DIAGNOSIS — H02884 Meibomian gland dysfunction left upper eyelid: Secondary | ICD-10-CM | POA: Diagnosis not present

## 2020-01-10 DIAGNOSIS — H43811 Vitreous degeneration, right eye: Secondary | ICD-10-CM | POA: Diagnosis not present

## 2020-01-22 ENCOUNTER — Encounter: Payer: Medicare Other | Admitting: Family

## 2020-02-07 ENCOUNTER — Telehealth: Payer: Self-pay | Admitting: Family

## 2020-02-07 NOTE — Telephone Encounter (Signed)
Caller: Ellysia Call back # [phone]  Patient has a yearly f/u (annual checkup) on 02/12/20 at 03:00pm. Patient would like to come in early that day and get her lab done so she don't have to fast till 03:00pm. Patient states she lives 2 hours away.

## 2020-02-08 NOTE — Telephone Encounter (Signed)
Spoke to patient and offered her an earlier appointment with Lenna Sciara but she comes from 2 hours away and has a dentist appointment prior to the appointment with [place]. I advised patient to fast 4-6 hours prior to coming in this will allow her to have an early breakfast instead of fasting all morning until 3 pm.

## 2020-02-12 ENCOUNTER — Other Ambulatory Visit: Payer: Self-pay

## 2020-02-12 ENCOUNTER — Ambulatory Visit (INDEPENDENT_AMBULATORY_CARE_PROVIDER_SITE_OTHER): Payer: Medicare Other | Admitting: Family

## 2020-02-12 VITALS — BP 126/75 | HR 72 | Temp 98.2°F | Resp 16 | Ht 66.0 in | Wt 127.0 lb

## 2020-02-12 DIAGNOSIS — E039 Hypothyroidism, unspecified: Secondary | ICD-10-CM

## 2020-02-12 DIAGNOSIS — Z87891 Personal history of nicotine dependence: Secondary | ICD-10-CM | POA: Diagnosis not present

## 2020-02-12 DIAGNOSIS — E7849 Other hyperlipidemia: Secondary | ICD-10-CM

## 2020-02-12 DIAGNOSIS — Z23 Encounter for immunization: Secondary | ICD-10-CM

## 2020-02-12 DIAGNOSIS — E559 Vitamin D deficiency, unspecified: Secondary | ICD-10-CM

## 2020-02-12 DIAGNOSIS — M810 Age-related osteoporosis without current pathological fracture: Secondary | ICD-10-CM | POA: Diagnosis not present

## 2020-02-12 DIAGNOSIS — Z0000 Encounter for general adult medical examination without abnormal findings: Secondary | ICD-10-CM | POA: Diagnosis not present

## 2020-02-12 MED ORDER — SHINGRIX 50 MCG/0.5ML IM SUSR: 0.5000 mL | Freq: Once | INTRAMUSCULAR | 0 refills | Status: AC

## 2020-02-12 MED ORDER — FAMOTIDINE 20 MG PO TABS: 20.0000 mg | ORAL_TABLET | Freq: Two times a day (BID) | ORAL | 1 refills | Status: DC

## 2020-02-12 MED ORDER — SIMVASTATIN 20 MG PO TABS: 20.0000 mg | ORAL_TABLET | Freq: Every day | ORAL | 1 refills | Status: DC

## 2020-02-12 MED ORDER — LEVOTHYROXINE SODIUM 88 MCG PO TABS: 88.0000 ug | ORAL_TABLET | Freq: Every day | ORAL | 1 refills | Status: DC

## 2020-02-12 NOTE — Progress Notes (Signed)
Subjective:    Dorothy [name] is a 69 y.o. female who presents for Medicare Annual/Subsequent preventive examination.  Preventive Screening-Counseling & Management  Tobacco Social History   Tobacco Use  Smoking Status Former Smoker  . Years: 20.00  . Types: Cigarettes  . Quit date: 11/11/2011  . Years since quitting: 8.2  Smokeless Tobacco Never Used     Problems Prior to Visit 1.   Current Problems (verified) Patient Active Problem List   Diagnosis Date Noted  . Chest tightness 10/26/2017  . White matter abnormality on MRI of brain 01/29/2015  . Preventative health care 11/05/2014  . Oral herpes 10/05/2014  . Right facial numbness 02/20/2014  . Colon polyp 10/24/2013  . Vitamin D deficiency 10/18/2012  . Elevated liver function tests 10/18/2012  . Paresthesias 07/23/2012  . Fatty liver disease, nonalcoholic 07/37/1062  . Osteoporosis 07/23/2012  . Avascular necrosis of hip ([place]) 07/18/2012  . GERD (gastroesophageal reflux disease) 07/12/2012  . Avascular necrosis of bone of hip ([place]) 07/12/2012  . Other hyperlipidemia 07/12/2012  . Hypothyroidism 07/12/2012  . Abnormal MRI of head 07/12/2012    Medications Prior to Visit Current Outpatient Medications on File Prior to Visit  Medication Sig Dispense Refill  . acyclovir (ZOVIRAX) 400 MG tablet TAKE 1 TABLET FIVE TIMES A DAY FOR 5 DAYS AT START OF COLD SORE 90 tablet 0  . Calcium Carbonate-Vitamin D (CALCIUM 600+D) 600-400 MG-UNIT per tablet Take 2 tablets by mouth daily.    . Cholecalciferol (VITAMIN D) 2000 UNITS CAPS Take 1 capsule by mouth daily.    . cyanocobalamin 1000 MCG tablet Take 1,000 mcg by mouth daily.    Marland Kitchen docusate sodium (COLACE) 100 MG capsule Take 200 mg by mouth daily.     . famotidine (PEPCID) 20 MG tablet TAKE 1 TABLET TWICE A DAY 180 tablet 1  . levothyroxine (SYNTHROID) 88 MCG tablet TAKE 1 TABLET DAILY BEFORE BREAKFAST 90 tablet 1  . simvastatin (ZOCOR) 20 MG tablet TAKE 1 TABLET AT  BEDTIME 90 tablet 1   No current facility-administered medications on file prior to visit.    Current Medications (verified) Current Outpatient Medications  Medication Sig Dispense Refill  . acyclovir (ZOVIRAX) 400 MG tablet TAKE 1 TABLET FIVE TIMES A DAY FOR 5 DAYS AT START OF COLD SORE 90 tablet 0  . Calcium Carbonate-Vitamin D (CALCIUM 600+D) 600-400 MG-UNIT per tablet Take 2 tablets by mouth daily.    . Cholecalciferol (VITAMIN D) 2000 UNITS CAPS Take 1 capsule by mouth daily.    . cyanocobalamin 1000 MCG tablet Take 1,000 mcg by mouth daily.    Marland Kitchen docusate sodium (COLACE) 100 MG capsule Take 200 mg by mouth daily.     . famotidine (PEPCID) 20 MG tablet TAKE 1 TABLET TWICE A DAY 180 tablet 1  . levothyroxine (SYNTHROID) 88 MCG tablet TAKE 1 TABLET DAILY BEFORE BREAKFAST 90 tablet 1  . simvastatin (ZOCOR) 20 MG tablet TAKE 1 TABLET AT BEDTIME 90 tablet 1   No current facility-administered medications for this visit.     Allergies (verified) Fosamax [alendronate sodium], Latex, and Penicillins   PAST HISTORY  Family History Family History  Problem Relation Age of Onset  . Parkinson's disease Father   . Alcohol abuse Father   . Other Mother        ? ovarian tumors  . Arthritis Maternal Grandmother   . Breast cancer Neg Hx     Social History Social History   Tobacco Use  .  Smoking status: Former Smoker    Years: 20.00    Types: Cigarettes    Quit date: 11/11/2011    Years since quitting: 8.2  . Smokeless tobacco: Never Used  Substance Use Topics  . Alcohol use: Yes    Alcohol/week: 5.0 - 7.0 standard drinks    Types: 5 - 7 Glasses of wine per week    Comment: occassional     Are there smokers in your home (other than you)? No  Risk Factors Current exercise habits: feels a little unsteady on her feet, due to some neuropathy Does exercise regularly  Dietary issues discussed: reports diet is very healthy, grows most of her food   Cardiac risk factors: advanced  age (older than 32 for men, 41 for women).  Depression Screen (Note: if answer to either of the following is "Yes", a more complete depression screening is indicated)   Over the past two weeks, have you felt down, depressed or hopeless? No  Over the past two weeks, have you felt little interest or pleasure in doing things? No  Have you lost interest or pleasure in daily life? No  Do you often feel hopeless? No  Do you cry easily over simple problems? No  Activities of Daily Living In your present state of health, do you have any difficulty performing the following activities?:  Driving? No Managing money?  No Feeding yourself? No Getting from bed to chair? No .  Climbing a flight of stairs? No Preparing food and eating?: No Bathing or showering? No Getting dressed: No Getting to the toilet? No Using the toilet:No Moving around from place to place: No In the past year have you fallen or had a near fall?:No   Are you sexually active?  Yes  Do you have more than one partner?  No  Hearing Difficulties: No Do you often ask people to speak up or repeat themselves? No Do you experience ringing or noises in your ears? No Do you have difficulty understanding soft or whispered voices? No   Do you feel that you have a problem with memory? No  Do you often misplace items? No  Do you feel safe at home?  Yes  Cognitive Testing  Alert? Yes  Normal Appearance?Yes  Oriented to person? Yes  Place? Yes   Time? Yes  Recall of three objects?  Yes  Can perform simple calculations? Yes  Displays appropriate judgment?Yes  Can read the correct time from a watch face?Yes   Advanced Directives have been discussed with the patient? Yes  List the Names of Other Physician/Practitioners you currently use: 1.  none  Indicate any recent Medical Services you may have received from other than [facility] providers in the past year (date may be approximate).  Immunization History  Administered Date(s)  Administered  . Influenza, High Dose Seasonal PF 01/25/2017, 03/07/2018  . Influenza,inj,Quad PF,6+ Mos 02/20/2014, 03/17/2016  . Moderna SARS-COVID-2 Vaccination 06/06/2019, 06/27/2019  . Pneumococcal Conjugate-13 03/17/2016  . Pneumococcal Polysaccharide-23 07/18/2012  . Tdap 02/20/2014  . Zoster 05/17/2009  . Zoster Recombinat (Shingrix) 03/07/2018    Screening Tests Health Maintenance  Topic Date Due  . INFLUENZA VACCINE  12/16/2019  . MAMMOGRAM  05/21/2021  . COLONOSCOPY  08/13/2021  . TETANUS/TDAP  02/21/2024  . DEXA SCAN  Completed  . COVID-19 Vaccine  Completed  . Hepatitis C Screening  Completed    All answers were reviewed with the patient and necessary referrals were made:  Nance Pear, NP  02/12/2020   History reviewed: allergies, current medications, past family history, past medical history, past social history, past surgical history and problem list  Review of Systems Pertinent items are noted in HPI.    Objective:     Body mass index is 20.5 kg/m. BP 126/75 (BP Location: Right Arm, Patient Position: Sitting, Cuff Size: Small)   Pulse 72   Temp 98.2 F (36.8 C) (Oral)   Resp 16   Ht 5\' 6"  (1.676 m)   Wt 127 lb (57.6 kg)   SpO2 99%   BMI 20.50 kg/m   Physical Exam  Constitutional: She is oriented to person, place, and time. She appears well-developed and well-nourished. No distress.  HENT:  Head: Normocephalic and atraumatic.  Right Ear: Tympanic membrane and ear canal normal.  Left Ear: Tympanic membrane and ear canal normal.  Mouth/Throat: not examined- pt wearing mask Eyes: Pupils are equal, round, and reactive to light. No scleral icterus.  Neck: Normal range of motion. No thyromegaly present.  Cardiovascular: Normal rate and regular rhythm.   No murmur heard. Pulmonary/Chest: Effort normal and breath sounds normal. No respiratory distress. He has no wheezes. She has no rales. She exhibits no tenderness.  Abdominal: Soft. Bowel  sounds are normal. She exhibits no distension and no mass. There is no tenderness. There is no rebound and no guarding.  Musculoskeletal: She exhibits no edema.  Lymphadenopathy:    She has no cervical adenopathy.  Neurological: She is alert and oriented to person, place, and time. She has normal patellar reflexes. She exhibits normal muscle tone. Coordination normal.  Skin: Skin is warm and dry.  Psychiatric: She has a normal mood and affect. Her behavior is normal. Judgment and thought content normal.  Breast/pelvic: deferred       Assessment & Plan:   Hyperlipidemia- continue simvastatin 20mg . Obtain lipid panel.  Hypothyroid- clinically stable on synthroid 88 mcg. Obtain follow up TSH, continue synthroid.    Vitamin D deficiency- on vit D 2000 iu OTC. Obtain follow up vit D level.  Osteoporosis- due for follow up bone density- will order.  Hx of tobacco abuse- obtain low dose screening chest ct.        Assessment:          Plan:     During the course of the visit the patient was educated and counseled about appropriate screening and preventive services including:    Influenza vaccine  Screening mammography  Bone densitometry screening  Colorectal cancer screening  Diet review for nutrition referral? Yes ____  Not Indicated _x___   Patient Instructions (the written plan) was given to the patient.  Medicare Attestation I have personally reviewed: The patient's medical and social history Their use of alcohol, tobacco or illicit drugs Their current medications and supplements The patient's functional ability including ADLs,fall risks, home safety risks, cognitive, and hearing and visual impairment Diet and physical activities Evidence for depression or mood disorders  The patient's weight, height, BMI, and visual acuity have been recorded in the chart.  I have made referrals, counseling, and provided education to the patient based on review of the above and  I have provided the patient with a written personalized care plan for preventive services.     Nance Pear, NP   02/12/2020

## 2020-02-12 NOTE — Patient Instructions (Signed)
You should be contacted about scheduling your follow up bone density test and CT scan. Complete lab work prior to leaving.

## 2020-02-13 ENCOUNTER — Other Ambulatory Visit (HOSPITAL_BASED_OUTPATIENT_CLINIC_OR_DEPARTMENT_OTHER): Payer: Self-pay | Admitting: Family

## 2020-02-13 DIAGNOSIS — Z1231 Encounter for screening mammogram for malignant neoplasm of breast: Secondary | ICD-10-CM

## 2020-02-13 LAB — COMPREHENSIVE METABOLIC PANEL
AG Ratio: 2 (calc) (ref 1.0–2.5)
ALT: 21 U/L (ref 6–29)
AST: 27 U/L (ref 10–35)
Albumin: 4.3 g/dL (ref 3.6–5.1)
Alkaline phosphatase (APISO): 66 U/L (ref 37–153)
BUN: 10 mg/dL (ref 7–25)
CO2: 27 mmol/L (ref 20–32)
Calcium: 9.8 mg/dL (ref 8.6–10.4)
Chloride: 104 mmol/L (ref 98–110)
Creat: 0.78 mg/dL (ref 0.50–0.99)
Globulin: 2.1 g/dL (calc) (ref 1.9–3.7)
Glucose, Bld: 95 mg/dL (ref 65–99)
Potassium: 4.2 mmol/L (ref 3.5–5.3)
Sodium: 140 mmol/L (ref 135–146)
Total Bilirubin: 0.6 mg/dL (ref 0.2–1.2)
Total Protein: 6.4 g/dL (ref 6.1–8.1)

## 2020-02-13 LAB — VITAMIN D 25 HYDROXY (VIT D DEFICIENCY, FRACTURES): Vit D, 25-Hydroxy: 38 ng/mL (ref 30–100)

## 2020-02-13 LAB — TSH: TSH: 3.23 mIU/L (ref 0.40–4.50)

## 2020-02-13 LAB — LIPID PANEL
Cholesterol: 181 mg/dL (ref <200)
HDL: 88 mg/dL (ref >=50)
LDL Cholesterol (Calc): 76 mg/dL (calc)
Non-HDL Cholesterol (Calc): 93 mg/dL (calc) (ref <130)
Total CHOL/HDL Ratio: 2.1 (calc) (ref <5.0)
Triglycerides: 86 mg/dL (ref <150)

## 2020-02-25 DIAGNOSIS — L821 Other seborrheic keratosis: Secondary | ICD-10-CM | POA: Diagnosis not present

## 2020-02-25 DIAGNOSIS — L853 Xerosis cutis: Secondary | ICD-10-CM | POA: Diagnosis not present

## 2020-02-25 DIAGNOSIS — Z809 Family history of malignant neoplasm, unspecified: Secondary | ICD-10-CM | POA: Diagnosis not present

## 2020-02-25 DIAGNOSIS — D485 Neoplasm of uncertain behavior of skin: Secondary | ICD-10-CM | POA: Diagnosis not present

## 2020-02-25 DIAGNOSIS — L820 Inflamed seborrheic keratosis: Secondary | ICD-10-CM | POA: Diagnosis not present

## 2020-02-25 DIAGNOSIS — L720 Epidermal cyst: Secondary | ICD-10-CM | POA: Diagnosis not present

## 2020-02-25 DIAGNOSIS — Z1283 Encounter for screening for malignant neoplasm of skin: Secondary | ICD-10-CM | POA: Diagnosis not present

## 2020-02-25 DIAGNOSIS — L259 Unspecified contact dermatitis, unspecified cause: Secondary | ICD-10-CM | POA: Diagnosis not present

## 2020-02-25 DIAGNOSIS — D1801 Hemangioma of skin and subcutaneous tissue: Secondary | ICD-10-CM | POA: Diagnosis not present

## 2020-03-12 DIAGNOSIS — Z23 Encounter for immunization: Secondary | ICD-10-CM | POA: Diagnosis not present

## 2020-05-27 ENCOUNTER — Ambulatory Visit (HOSPITAL_BASED_OUTPATIENT_CLINIC_OR_DEPARTMENT_OTHER)
Admission: RE | Admit: 2020-05-27 | Discharge: 2020-05-27 | Disposition: A | Discharge status: Home / Self Care | Payer: Medicare Other | Source: Ambulatory Visit | Attending: Family | Admitting: Family

## 2020-05-27 ENCOUNTER — Other Ambulatory Visit: Payer: Self-pay

## 2020-05-27 DIAGNOSIS — M810 Age-related osteoporosis without current pathological fracture: Secondary | ICD-10-CM | POA: Diagnosis not present

## 2020-05-27 DIAGNOSIS — Z87891 Personal history of nicotine dependence: Secondary | ICD-10-CM | POA: Diagnosis present

## 2020-05-27 DIAGNOSIS — Z1231 Encounter for screening mammogram for malignant neoplasm of breast: Secondary | ICD-10-CM | POA: Insufficient documentation

## 2020-07-16 ENCOUNTER — Other Ambulatory Visit: Payer: Self-pay | Admitting: Family

## 2020-10-20 LAB — HM COLONOSCOPY

## 2020-10-27 ENCOUNTER — Other Ambulatory Visit: Payer: Self-pay | Admitting: Family

## 2020-10-28 ENCOUNTER — Telehealth: Payer: Self-pay | Admitting: Family

## 2020-10-28 NOTE — Telephone Encounter (Signed)
Patient states she needs 180 tablet of the famotidine because she takes it twice a day    Medication: simvastatin (ZOCOR) 20 MG tablet [[id number]]   famotidine (PEPCID) 20 MG tablet [[id number]]     levothyroxine (SYNTHROID) 88 MCG tablet [[id number]]   Has the patient contacted their pharmacy? no (If no, request that the patient contact the pharmacy for the refill.) (If yes, when and what did the pharmacy advise?)    Preferred Pharmacy (with phone number or street name):  [facility] [place] to [facility]  [place], [place] [address]  Phone:  [phone]  Fax:  [phone]    Agent: Please be advised that RX refills may take up to 3 business days. We ask that you follow-up with your pharmacy.

## 2020-10-29 ENCOUNTER — Other Ambulatory Visit: Payer: Self-pay

## 2020-10-29 MED ORDER — LEVOTHYROXINE SODIUM 88 MCG PO TABS: 88.0000 ug | ORAL_TABLET | Freq: Every day | ORAL | 1 refills | Status: AC

## 2020-10-29 MED ORDER — FAMOTIDINE 20 MG PO TABS: 20.0000 mg | ORAL_TABLET | Freq: Two times a day (BID) | ORAL | 1 refills | Status: AC

## 2020-10-29 NOTE — Telephone Encounter (Signed)
Medications sent to her pharmacy as requested

## 2021-01-25 ENCOUNTER — Other Ambulatory Visit: Payer: Self-pay | Admitting: Family

## 2021-04-15 ENCOUNTER — Other Ambulatory Visit: Payer: Self-pay | Admitting: Family

## 2021-04-25 ENCOUNTER — Other Ambulatory Visit: Payer: Self-pay | Admitting: Family

## 2021-04-25 NOTE — Telephone Encounter (Signed)
Pt has not been seen in greater than 1 year.  OK to send 7 day supply to local pharmacy, but needs OV prior to additional refills.

## 2021-04-27 NOTE — Telephone Encounter (Signed)
Patient reports he moved to [facility] and is now seeing Dr .Cyndi Lennert.  "She will miss Melissa a lot"

## 2021-05-07 IMAGING — MG DIGITAL SCREENING BILAT W/ TOMO W/ CAD
6 of 10 series · 6 of 30 positions shown · non-contrast
Comparison: Previous exam(s).

CLINICAL DATA: Screening.

EXAM:
DIGITAL SCREENING BILATERAL MAMMOGRAM WITH TOMO AND CAD

[R MLO synth-2D (1 of 2)]
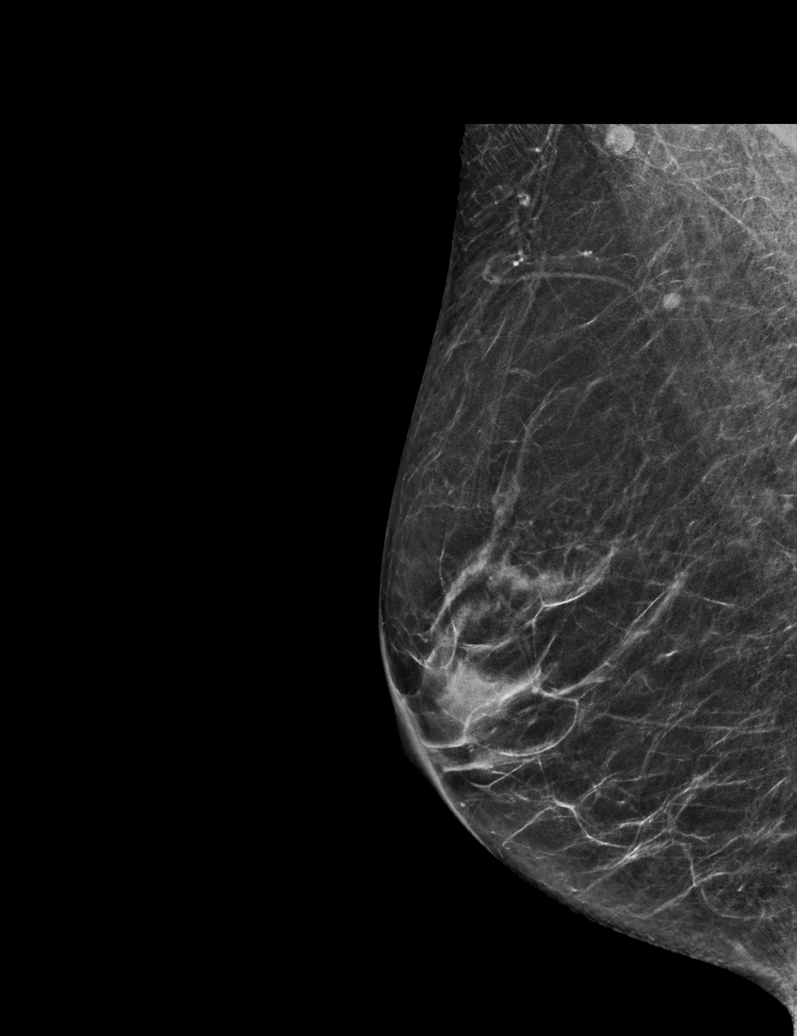

[R MLO synth-2D (2 of 2)]
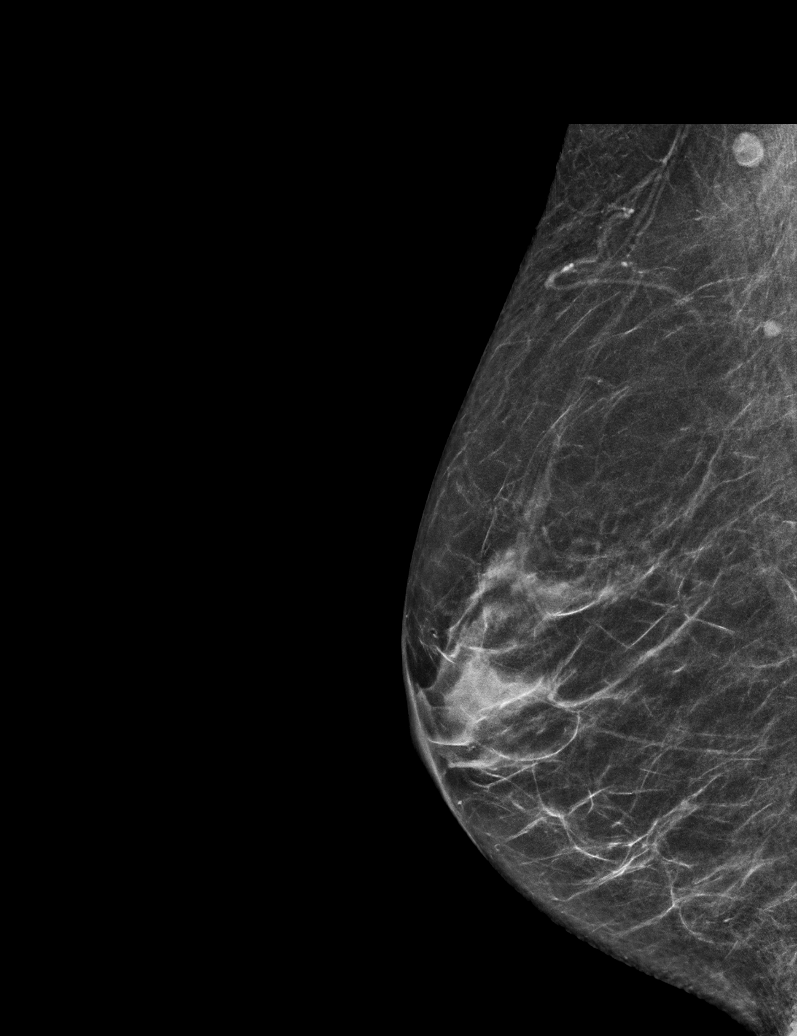

[R CC synth-2D]
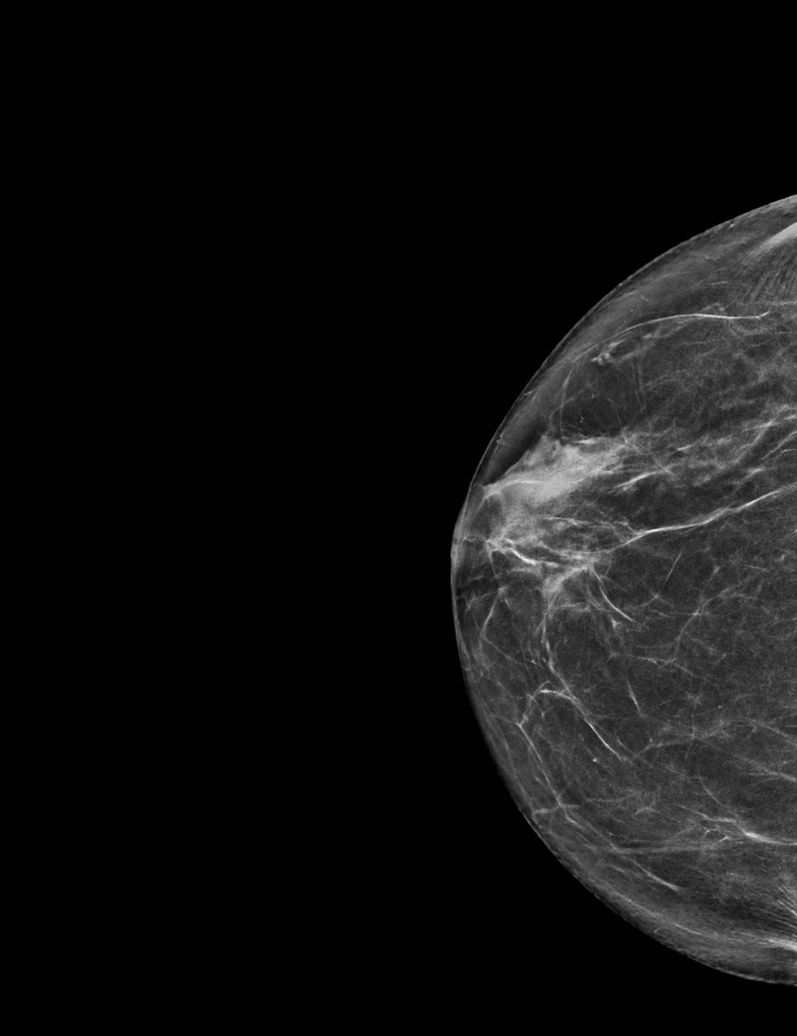

[L CC synth-2D]
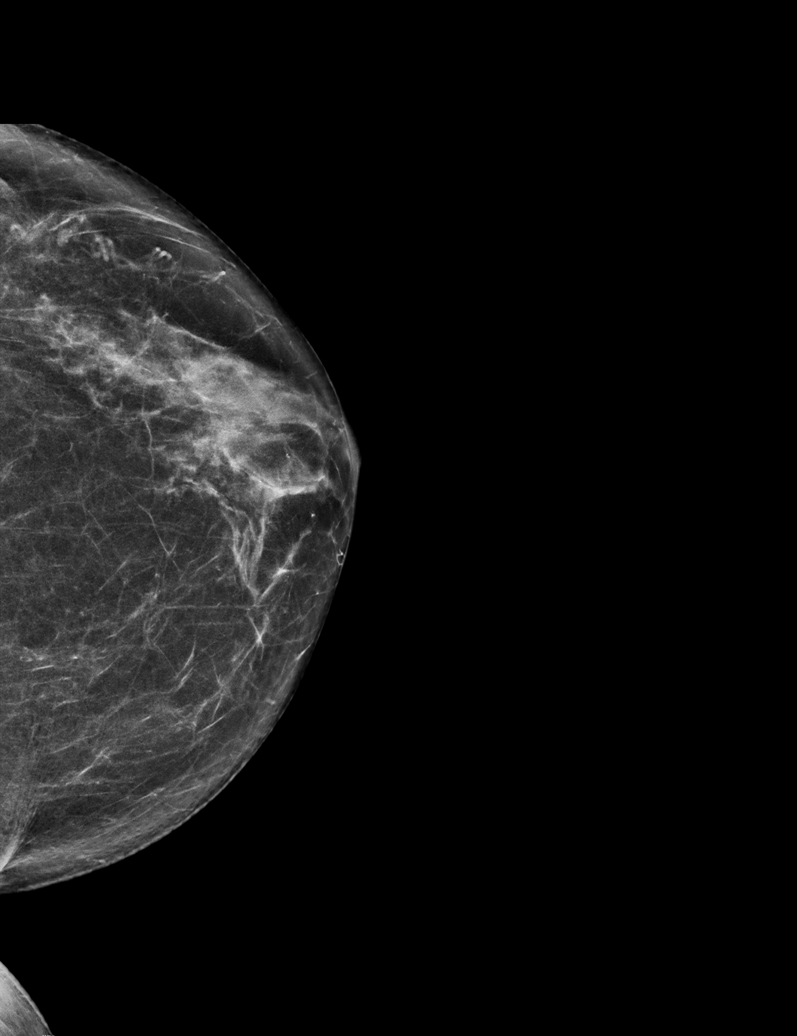

[L MLO synth-2D]
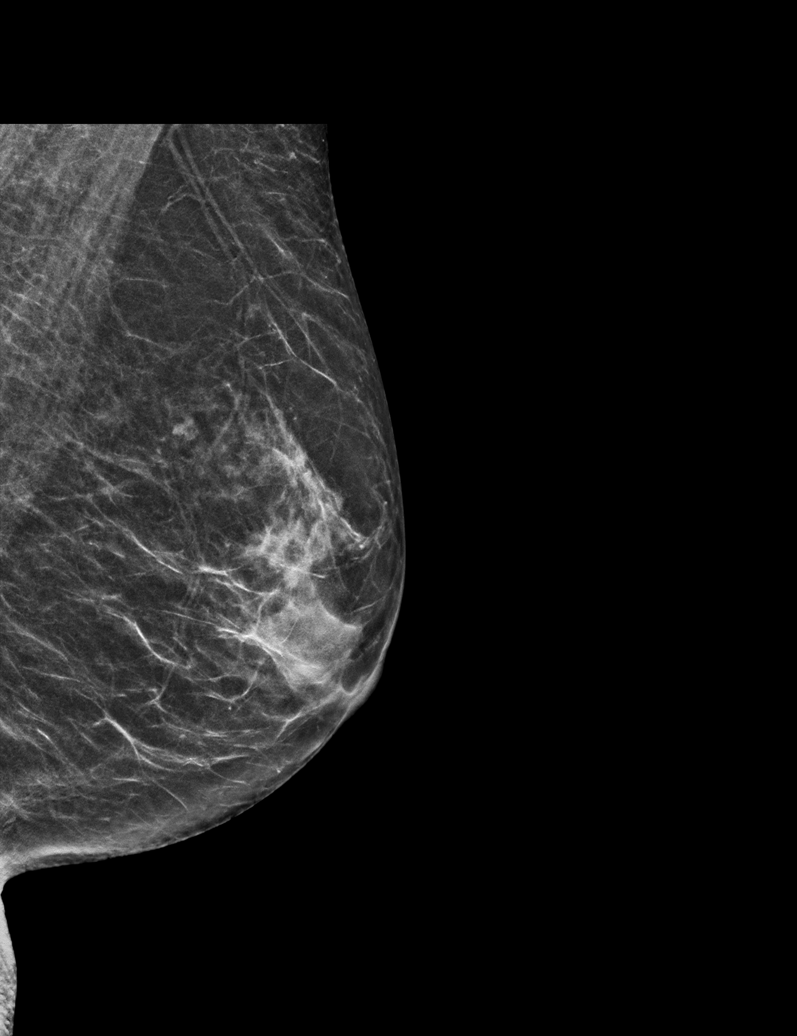

[R CC tomo · tomo slice 31/61.0]
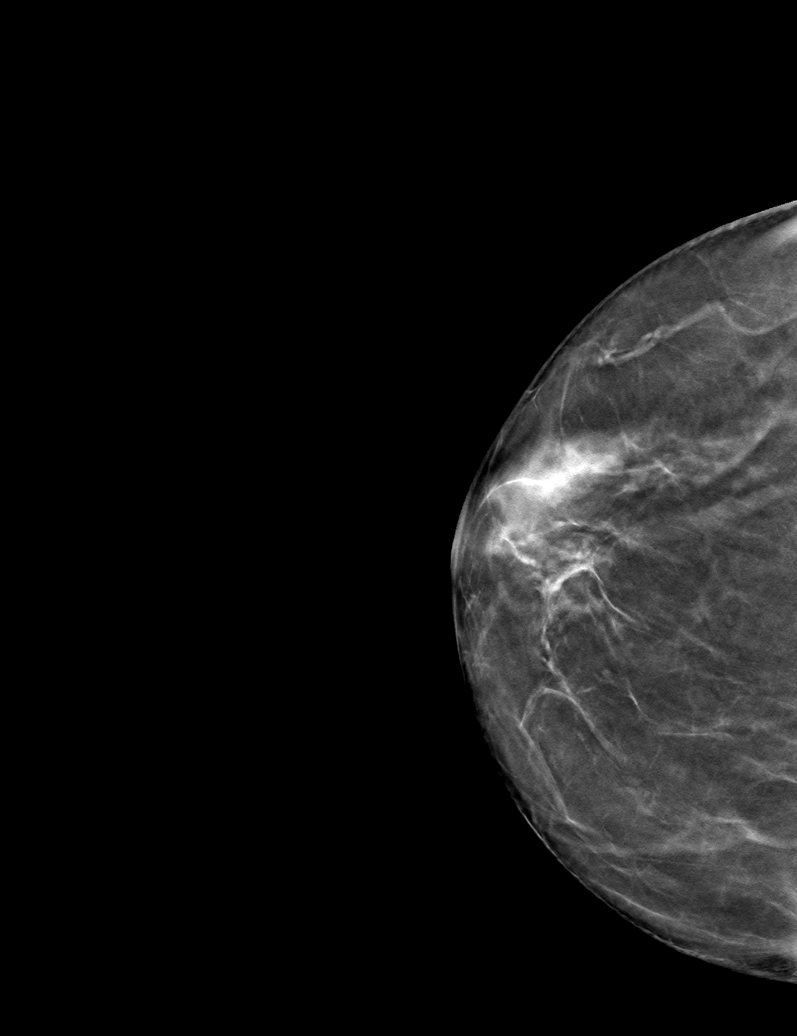

[6 of 30 positions shown; findings below may reference images not displayed]

ACR Breast Density Category b: There are scattered areas of
fibroglandular density.
FINDINGS: There are no findings suspicious for malignancy. Images were
processed with CAD.
IMPRESSION: No mammographic evidence of malignancy. A result letter of this
screening mammogram will be mailed directly to the patient.

RECOMMENDATION:
Screening mammogram in one year. (Code:[id number])

BI-RADS CATEGORY  1: Negative.
